# Patient Record
Sex: Female | Born: 1935 | Race: White | Hispanic: No | Marital: Married | Smoking: Never smoker
Health system: Southern US, Community
[De-identification: ages and names within clinical notes are randomized; demographics above are authoritative.]

## PROBLEM LIST (undated history)

## (undated) DIAGNOSIS — G4730 Sleep apnea, unspecified: Secondary | ICD-10-CM

## (undated) DIAGNOSIS — J45909 Unspecified asthma, uncomplicated: Secondary | ICD-10-CM

## (undated) DIAGNOSIS — M353 Polymyalgia rheumatica: Secondary | ICD-10-CM

## (undated) DIAGNOSIS — J40 Bronchitis, not specified as acute or chronic: Secondary | ICD-10-CM

## (undated) DIAGNOSIS — N183 Chronic kidney disease, stage 3 unspecified: Secondary | ICD-10-CM

## (undated) DIAGNOSIS — M5126 Other intervertebral disc displacement, lumbar region: Secondary | ICD-10-CM

## (undated) DIAGNOSIS — M1990 Unspecified osteoarthritis, unspecified site: Secondary | ICD-10-CM

## (undated) DIAGNOSIS — I499 Cardiac arrhythmia, unspecified: Secondary | ICD-10-CM

## (undated) DIAGNOSIS — D649 Anemia, unspecified: Secondary | ICD-10-CM

## (undated) DIAGNOSIS — M48061 Spinal stenosis, lumbar region without neurogenic claudication: Secondary | ICD-10-CM

## (undated) DIAGNOSIS — I272 Pulmonary hypertension, unspecified: Secondary | ICD-10-CM

## (undated) DIAGNOSIS — G709 Myoneural disorder, unspecified: Secondary | ICD-10-CM

## (undated) DIAGNOSIS — Z950 Presence of cardiac pacemaker: Secondary | ICD-10-CM

## (undated) DIAGNOSIS — I10 Essential (primary) hypertension: Secondary | ICD-10-CM

## (undated) HISTORY — PX: ESOPHAGOGASTRODUODENOSCOPY: SHX1529

## (undated) HISTORY — PX: APPENDECTOMY: SHX54

## (undated) HISTORY — PX: JOINT REPLACEMENT: SHX530

## (undated) HISTORY — PX: HEMORROIDECTOMY: SUR656

## (undated) HISTORY — PX: COLON SURGERY: SHX602

## (undated) HISTORY — PX: ABDOMINAL HYSTERECTOMY: SHX81

## (undated) HISTORY — PX: EYE SURGERY: SHX253

## (undated) HISTORY — PX: TRIGGER FINGER RELEASE: SHX641

## (undated) HISTORY — PX: CHOLECYSTECTOMY: SHX55

## (undated) HISTORY — PX: TONSILLECTOMY: SUR1361

---

## 2009-03-25 ENCOUNTER — Emergency Department (HOSPITAL_BASED_OUTPATIENT_CLINIC_OR_DEPARTMENT_OTHER): Admission: EM | Admit: 2009-03-25 | Discharge: 2009-03-25 | Payer: Self-pay | Admitting: Emergency Medicine

## 2018-02-24 ENCOUNTER — Other Ambulatory Visit (HOSPITAL_COMMUNITY): Payer: Self-pay | Admitting: Orthopedic Surgery

## 2018-02-24 DIAGNOSIS — M5441 Lumbago with sciatica, right side: Secondary | ICD-10-CM

## 2018-05-19 ENCOUNTER — Ambulatory Visit (HOSPITAL_COMMUNITY): Payer: Self-pay | Admitting: Orthopedic Surgery

## 2018-05-19 NOTE — H&P (Signed)
HPI For location, patient reports right. For severity, she reports pain level 7/10. For previous surgery, she reports none. For prior imaging, she reports none. For previous pt, she reports none. For work related, she reports no. For working, she reports no. Very pleasant female patient 84 years of age that presents to clinic with a long history of low back pain as well as radicular pain into her right lower extremity the patient has been receiving injections at [facility] she has received 2 ESI injections over the last few months she does report that her low back pain is decreased at this point and does report significant pain of the right lower extremity that radiates into her leg and foot. She reports weakness and severe pain especially with ambulation. And carries a diagnosis of atrial fibrillation as well as pulmonary hypertension she is unable to be on blood thinners because she is "a free bleeder."  ROS as noted in the HPI Reported by Patient CONSTITUTIONAL: no Fever, no Weight Gain, no Weight Loss  HEAD, EARS, EYES, NOSE, AND THROAT: no Dry Eyes, no Eye Irritation, no Vision Changes, no Sore Throat  CARDIOVASCULAR: LEG SWELLING, but no Chest Pain / Angina, no Palpitations, no Poor Circulation  RESPIRATORY: SHORTNESS OF BREATH, ASTHMA, but no Cough, no COPD  GASTROINTESTINAL: no Nausea / Vomiting, no Black Stools / Blood in Stool, no Diarrhea, no Vomiting Blood  GENITOURINARY: no Blood in Urine, no Difficulty Urinating  MUSCULOSKELETAL: JOINT PAIN, JOINT SWELLING, RHEUMATOID ARTHRITIS, but no Lost 2 or more inches in Height  SKIN: no Rash, no Varicose Veins  NEUROLOGIC: NUMBNESS, PROBLEMS WITH BALANCE, but no Seizures, no Dizziness  PSYCHIATRIC: DEPRESSION, but no Anxiety  ENDOCRINE: no Heat Intolerance  BLOOD / LYMPH: ANEMIA, BLEED EASILY, BRUISE EASILY, but no Swollen Glands   Allergies: NKDA  Physical Exam:  Clinical exam: Currently is a pleasant individual, who  appears younger than their stated age.  She Is alert and orientated 3.  Positive shortness of breath (baseline COPD on supplemental oxygen), chest pain.  Abdomen is soft and non-tender, negative loss of bowel and bladder control, no rebound tenderness.  Negative: skin lesions abrasions contusions  Peripheral pulses: 1+ and symmetrical dorsalis pedis/posterior tibialis pulses. Compartment soft and nontender.  Gait pattern: Significant right radicular leg pain altering her overall gait pattern.  Patient unfortunately had weakness and fell recently and has significant bruising and ecchymosis over the face.  Assistive devices: Currently using a walker/wheelchair depending upon the severity of her pain.  Neuro: Positive right L5 radiculopathy.  She has weakness of the EHL tibialis anterior and severe right leg pain in the L5 dermatome.  Positive straight leg raise test.  Positive numbness and dysesthesias.  No significant symptoms on the left lower extremity.  Musculoskeletal: Moderate low back pain.  No significant hip, knee, ankle pain with isolated joint range of motion.  Patient had recent epidural steroid injection which did not provide as much relief as the L5 selective nerve root block on the right side which was done prior to her seeing me.  Notes 2 days of significant pain relief following the right L5 selective nerve root block.  Her MRI does demonstrate L4-5 impingement and lateral recess stenosis causing irritation to the right L5 traversing nerve root.  No acute fracture is seen.  Assessment/Plan:  Patient presents today for preoperative H&P. We have gone over the surgical procedure which would be a lumbar decompression/discectomy L4-5. Patient has underlying spinal stenosis with a disc herniation causing  marked radicular right leg pain which is significantly affected her quality-of-life.  Despite injection therapy, activity modification, and pain medications she has unrelenting  debilitating radicular right leg pain and moderate to severe back pain. Preoperative MRI completed on 03/07/18 demonstrated spinal stenosis at L4-5 with a broad-based right-sided disc protrusion causing significant right lateral recess stenosis and impingement on the descending right L5 nerve root. There is also a left sided hard disc osteophyte causing left lateral recess stenosis. Mild degenerative changes at the L3-4 and L5-S1 level.  Because of the patient's underlying pulmonary and cardiac disease he did obtain preoperative medical clearance. Both her cardiologist and her pulmonologist had cleared her for surgery. Caveat being that if she has no flare up of her asthma or lung infection that she would be at an acceptable all be it higher risk for surgery.  I have gone over the surgical procedure with the patient and her family and all their questions were addressed. We have paid particular attention to the postoperative complications especially given the increased risk secondary to her pulmonary disease.  Risks and benefits of surgery were discussed with the patient. These include: Infection, bleeding, death, stroke, paralysis, ongoing or worse pain, need for additional surgery, leak of spinal fluid, adjacent segment degeneration requiring additional surgery, post-operative hematoma formation that can result in neurological compromise and the need for urgent/emergent re-operation. Loss in bowel and bladder control. Injury to major vessels that could result in the need for urgent abdominal surgery to stop bleeding. Risk of deep venous thrombosis (DVT) and the need for additional treatment. Recurrent disc herniation resulting in the need for revision surgery, which could include fusion surgery (utilizing instrumentation such as pedicle screws and intervertebral cages).  Additional risk: If instrumentation is used there is a risk of migration, or breakage of that hardware that could require additional  surgery.   We will plan on moving forward with the L4-5 lumbar decompression on 05/28/18.

## 2018-05-21 NOTE — Pre-Procedure Instructions (Signed)
Stephanie Santiago  05/21/2018      [id number] - [place], [place] - [address] [place] [address] Phone: [phone] Fax: [phone]    Your procedure is scheduled on Wednesday November 6.  Report to [facility] Admitting at 10:00 A.M.  Call this number if you have problems the morning of surgery:  [phone]   Remember:  Do not eat or drink after midnight.    Take these medicines the morning of surgery with A SIP OF WATER:   Atenolol (Tenormin) Digoxin (Lanoxin) Flecainide (Tambocor) Gabapentin (neurontin) Pantoprazole (protonix) Prednisone (Deltasone) Flovent inhaler Eye drops if needed Tramadol (ultram) if needed Albuterol if needed (please bring inhaler to hospital with you)  7 days prior to surgery STOP taking any Aspirin(unless otherwise instructed by your surgeon), Aleve, Naproxen, Ibuprofen, Motrin, Advil, Goody's, BC's, all herbal medications, fish oil, and all vitamins     Do not wear jewelry, make-up or nail polish.  Do not wear lotions, powders, or perfumes, or deodorant.  Do not shave 48 hours prior to surgery.  Men may shave face and neck.  Do not bring valuables to the hospital.  [facility] is not responsible for any belongings or valuables.  Contacts, dentures or bridgework may not be worn into surgery.  Leave your suitcase in the car.  After surgery it may be brought to your room.  For patients admitted to the hospital, discharge time will be determined by your treatment team.  Patients discharged the day of surgery will not be allowed to drive home.   Special instructions:    [place]- Preparing For Surgery  Before surgery, you can play an important role. Because skin is not sterile, your skin needs to be as free of germs as possible. You can reduce the number of germs on your skin by washing with CHG (chlorahexidine gluconate) Soap before surgery.  CHG is an  antiseptic cleaner which kills germs and bonds with the skin to continue killing germs even after washing.    Oral Hygiene is also important to reduce your risk of infection.  Remember - BRUSH YOUR TEETH THE MORNING OF SURGERY WITH YOUR REGULAR TOOTHPASTE  Please do not use if you have an allergy to CHG or antibacterial soaps. If your skin becomes reddened/irritated stop using the CHG.  Do not shave (including legs and underarms) for at least 48 hours prior to first CHG shower. It is OK to shave your face.  Please follow these instructions carefully.   1. Shower the NIGHT BEFORE SURGERY and the MORNING OF SURGERY with CHG.   2. If you chose to wash your hair, wash your hair first as usual with your normal shampoo.  3. After you shampoo, rinse your hair and body thoroughly to remove the shampoo.  4. Use CHG as you would any other liquid soap. You can apply CHG directly to the skin and wash gently with a scrungie or a clean washcloth.   5. Apply the CHG Soap to your body ONLY FROM THE NECK DOWN.  Do not use on open wounds or open sores. Avoid contact with your eyes, ears, mouth and genitals (private parts). Wash Face and genitals (private parts)  with your normal soap.  6. Wash thoroughly, paying special attention to the area where your surgery will be performed.  7. Thoroughly rinse your body with warm water from the neck down.  8. DO NOT shower/wash with your normal soap after using and rinsing off the CHG Soap.  9. Pat yourself dry with a CLEAN TOWEL.  10. Wear CLEAN PAJAMAS to bed the night before surgery, wear comfortable clothes the morning of surgery  11. Place CLEAN SHEETS on your bed the night of your first shower and DO NOT SLEEP WITH PETS.    Day of Surgery:  Do not apply any deodorants/lotions.  Please wear clean clothes to the hospital/surgery center.   Remember to brush your teeth WITH YOUR REGULAR TOOTHPASTE.    Please read over the following fact sheets that you  were given. Coughing and Deep Breathing and Surgical Site Infection Prevention

## 2018-05-22 ENCOUNTER — Encounter (HOSPITAL_COMMUNITY): Payer: Self-pay

## 2018-05-22 ENCOUNTER — Other Ambulatory Visit: Payer: Self-pay

## 2018-05-22 ENCOUNTER — Encounter (HOSPITAL_COMMUNITY)
Admission: RE | Admit: 2018-05-22 | Discharge: 2018-05-22 | Disposition: A | Discharge status: Home / Self Care | Payer: Medicare HMO | Source: Ambulatory Visit | Attending: Orthopedic Surgery | Admitting: Orthopedic Surgery

## 2018-05-22 DIAGNOSIS — N183 Chronic kidney disease, stage 3 (moderate): Secondary | ICD-10-CM | POA: Insufficient documentation

## 2018-05-22 DIAGNOSIS — Z950 Presence of cardiac pacemaker: Secondary | ICD-10-CM | POA: Diagnosis not present

## 2018-05-22 DIAGNOSIS — I129 Hypertensive chronic kidney disease with stage 1 through stage 4 chronic kidney disease, or unspecified chronic kidney disease: Secondary | ICD-10-CM | POA: Insufficient documentation

## 2018-05-22 DIAGNOSIS — I272 Pulmonary hypertension, unspecified: Secondary | ICD-10-CM | POA: Diagnosis not present

## 2018-05-22 DIAGNOSIS — D649 Anemia, unspecified: Secondary | ICD-10-CM | POA: Diagnosis not present

## 2018-05-22 DIAGNOSIS — J45909 Unspecified asthma, uncomplicated: Secondary | ICD-10-CM | POA: Diagnosis not present

## 2018-05-22 DIAGNOSIS — Z01812 Encounter for preprocedural laboratory examination: Secondary | ICD-10-CM | POA: Diagnosis present

## 2018-05-22 HISTORY — DX: Unspecified osteoarthritis, unspecified site: M19.90

## 2018-05-22 HISTORY — DX: Polymyalgia rheumatica: M35.3

## 2018-05-22 HISTORY — DX: Pulmonary hypertension, unspecified: I27.20

## 2018-05-22 HISTORY — DX: Presence of cardiac pacemaker: Z95.0

## 2018-05-22 HISTORY — DX: Bronchitis, not specified as acute or chronic: J40

## 2018-05-22 HISTORY — DX: Sleep apnea, unspecified: G47.30

## 2018-05-22 HISTORY — DX: Anemia, unspecified: D64.9

## 2018-05-22 HISTORY — DX: Chronic kidney disease, stage 3 (moderate): N18.3

## 2018-05-22 HISTORY — DX: Myoneural disorder, unspecified: G70.9

## 2018-05-22 HISTORY — DX: Chronic kidney disease, stage 3 unspecified: N18.30

## 2018-05-22 HISTORY — DX: Cardiac arrhythmia, unspecified: I49.9

## 2018-05-22 HISTORY — DX: Unspecified asthma, uncomplicated: J45.909

## 2018-05-22 HISTORY — DX: Essential (primary) hypertension: I10

## 2018-05-22 LAB — BASIC METABOLIC PANEL
ANION GAP: 11 (ref 5–15)
BUN: 19 mg/dL (ref 8–23)
CALCIUM: 8.8 mg/dL — AB (ref 8.9–10.3)
CO2: 27 mmol/L (ref 22–32)
Chloride: 100 mmol/L (ref 98–111)
Creatinine, Ser: 1.4 mg/dL — ABNORMAL HIGH (ref 0.44–1.00)
GFR calc Af Amer: 39 mL/min — ABNORMAL LOW (ref >60)
GFR calc non Af Amer: 33 mL/min — ABNORMAL LOW (ref >60)
GLUCOSE: 186 mg/dL — AB (ref 70–99)
POTASSIUM: 4.3 mmol/L (ref 3.5–5.1)
Sodium: 138 mmol/L (ref 135–145)

## 2018-05-22 LAB — CBC
HEMATOCRIT: 29.3 % — AB (ref 36.0–46.0)
Hemoglobin: 8.6 g/dL — ABNORMAL LOW (ref 12.0–15.0)
MCH: 28.6 pg (ref 26.0–34.0)
MCHC: 29.4 g/dL — ABNORMAL LOW (ref 30.0–36.0)
MCV: 97.3 fL (ref 80.0–100.0)
NRBC: 0 % (ref 0.0–0.2)
Platelets: 241 10*3/uL (ref 150–400)
RBC: 3.01 MIL/uL — ABNORMAL LOW (ref 3.87–5.11)
RDW: 17.5 % — AB (ref 11.5–15.5)
WBC: 10.6 10*3/uL — ABNORMAL HIGH (ref 4.0–10.5)

## 2018-05-22 LAB — ABO/RH: ABO/RH(D): O POS

## 2018-05-22 LAB — TYPE AND SCREEN
ABO/RH(D): O POS
Antibody Screen: NEGATIVE

## 2018-05-22 LAB — SURGICAL PCR SCREEN
MRSA, PCR: NEGATIVE
Staphylococcus aureus: POSITIVE — AB

## 2018-05-22 NOTE — Progress Notes (Signed)
PCP - Brooke Bonito MD Cardiologist -  Sandy Salaam MD  Chest x-ray - N/A EKG - 08/2017 REquested ECHO - 2018 Requested  PPM: medtronic. Leta Jungling emailed  Sleep Study - ~10 yrs ago CPAP -  Uses 2L O2  Blood Thinner Instructions: N/A Aspirin Instructions: N/A  Anesthesia review: yes. Requested outside docs.cardiac hx  Patient denies shortness of breath, fever, cough and chest pain at PAT appointment   Patient verbalized understanding of instructions that were given to them at the PAT appointment. Patient was also instructed that they will need to review over the PAT instructions again at home before surgery.

## 2018-05-23 ENCOUNTER — Encounter (HOSPITAL_COMMUNITY): Payer: Self-pay

## 2018-05-23 NOTE — Progress Notes (Signed)
I called a prescription for Mupirocin ointment to [facility], corner of [address], [place].

## 2018-05-23 NOTE — Anesthesia Preprocedure Evaluation (Addendum)
Anesthesia Evaluation  Patient identified by MRN, date of birth, ID band Patient awake    Reviewed: Allergy & Precautions, H&P , NPO status , Patient's Chart, lab work & pertinent test results, reviewed documented beta blocker date and time   Airway Mallampati: II  TM Distance: >3 FB Neck ROM: full    Dental no notable dental hx.    Pulmonary asthma , sleep apnea ,    Pulmonary exam normal breath sounds clear to auscultation       Cardiovascular Exercise Tolerance: Good hypertension, + dysrhythmias Atrial Fibrillation + pacemaker  Rhythm:regular Rate:Normal  EKG: 09/11/2017  Atrial fibrillation, ventricular rate 88.  CV: TTE 08/21/2016  Normal left ventricular size and systolic function with no appreciable segmental abnormality. Ejection fraction is visually estimated at 55-60% Mild to moderate tricuspid regurgitation with Moderate pulmonary hypertension and Mild Right atrial enlargement Mild mitral regurgitation, Mild Left atrial enlargement. Mild Right atrial enlargement   Neuro/Psych negative neurological ROS  negative psych ROS   GI/Hepatic negative GI ROS, Neg liver ROS,   Endo/Other  negative endocrine ROS  Renal/GU CRFRenal diseasenegative Renal ROS  negative genitourinary   Musculoskeletal  (+) Arthritis , Osteoarthritis,    Abdominal   Peds  Hematology negative hematology ROS (+) Blood dyscrasia, anemia ,   Anesthesia Other Findings   Reproductive/Obstetrics negative OB ROS                           Anesthesia Physical Anesthesia Plan  ASA: IV  Anesthesia Plan: General   Post-op Pain Management:    Induction: Intravenous  PONV Risk Score and Plan: 3 and Ondansetron and Treatment may vary due to age or medical condition  Airway Management Planned: Oral ETT  Additional Equipment:   Intra-op Plan:   Post-operative Plan: Extubation in OR and Possible Post-op  intubation/ventilation  Informed Consent: I have reviewed the patients History and Physical, chart, labs and discussed the procedure including the risks, benefits and alternatives for the proposed anesthesia with the patient or authorized representative who has indicated his/her understanding and acceptance.   Dental Advisory Given  Plan Discussed with: CRNA, Anesthesiologist and Surgeon  Anesthesia Plan Comments: (Pt has clearance from cardiology and pulmonology. See PAT note 05/23/2018 by Antionette Poles, PA-C.  )      Anesthesia Quick Evaluation

## 2018-05-23 NOTE — Progress Notes (Signed)
Anesthesia Chart Review:  Case:  [id number] Date/Time:  05/28/18 1145   Procedure:  L4-5 decompression/disectomy (N/A ) - 120 mins   Anesthesia type:  General   Pre-op diagnosis:  L4-5 disc with stenosis   Location:  [facility] ROOM 04 / [facility]   Surgeon:  Venita Lick, MD      DISCUSSION: 84 yo female never smoker. 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  She has clearance from pulmonology Lysle Pearl, PA-C 0/30/2019 stating "If timing is critical given the severity of her condition, I think she can proceed with surgery from pulmonary standpoint.  It is important for her to be placed on oxygen following surgery, if needed.  I would delay surgery if she is experiencing symptoms suggestive of asthma flareup from (severe cough, high heavy congestion, wheezing, fever, chills, etc.).  I would also get input from a cardiologist."  Clearance from cardiology Dr. Sandy Salaam "From a cardiac standpoint he believes that she is stable and doing well with the Micra, and her heart function.  He is more concerned with her lung function, and her severe COPD.  Feels that she would need clearance from pulmonologist."  Medtronic device rep emailed by PAT nursing. Device form faced to [facility] device [facility].  She will need DOS eval by anesthesiologist. Anticipate she can proceed as planned barring acute status change.  VS: BP (!) (P) 128/58   Pulse (P) 63   Resp (P) 20   Ht (P) 5\' 3"  (1.6 m)   SpO2 (P) 95%   PROVIDERS: Brooke Bonito, MD is PCP  Sandy Salaam, MD is Cardiologist  Foy Guadalajara, MD is Hematologist  Lysle Pearl, NP is Pulmonology provider  LABS: Labs reviewed: Acceptable for surgery. Low hgb and elevated creatinine c/w pt history, review of previous labs in care everywhere shows similar. Hgb called  to Dr. Shon Baton office. (all labs ordered are listed, but only abnormal results are displayed)  Labs Reviewed  SURGICAL PCR SCREEN - Abnormal; Notable for the following components:      Result Value   Staphylococcus aureus POSITIVE (*)    All other components within normal limits  CBC - Abnormal; Notable for the following components:   WBC 10.6 (*)    RBC 3.01 (*)    Hemoglobin 8.6 (*)    HCT 29.3 (*)    MCHC 29.4 (*)    RDW 17.5 (*)    All other components within normal limits  BASIC METABOLIC PANEL - Abnormal; Notable for the following components:   Glucose, Bld 186 (*)    Creatinine, Ser 1.40 (*)    Calcium 8.8 (*)    GFR calc non Af Amer 33 (*)    GFR calc Af Amer 39 (*)    All other components within normal limits  TYPE AND SCREEN  ABO/RH     IMAGES: CHEST - 2 VIEW 11/27/2017 (care everywhere):  COMPARISON: None.  FINDINGS: Unchanged position of lead this pacemaker. Unchanged mild cardiomegaly with calcific aortic atherosclerosis. Both lungs are clear. The visualized skeletal structures are unremarkable.  IMPRESSION: Mild cardiomegaly and calcific aortic atherosclerosis (ICD10-I70.0).  EKG: 09/11/2017 (care everywhere, narrative only, requested): Atrial fibrillation, ventricular rate 88.  CV: TTE 08/21/2016 (care everywhere):  Conclusions Summary Normal left ventricular size and systolic function with no appreciable segmental abnormality. Ejection fraction is visually estimated at 55-60%  Mild to moderate tricuspid regurgitation with Moderate pulmonary hypertension and Mild Right atrial enlargement Mild mitral regurgitation, Mild Left atrial enlargement. Mild Right atrial enlargement Past Medical History:  Diagnosis Date  . Anemia   . Arthritis    bilateral knees  . Asthma   . Bronchitis   . Dysrhythmia    A-fib  . Hypertension   . Neuromuscular disorder (HCC)    spine  . PMR (polymyalgia rheumatica) (HCC)   . Presence of permanent cardiac pacemaker    . Pulmonary hypertension (HCC)   . Sleep apnea     Past Surgical History:  Procedure Laterality Date  . ABDOMINAL HYSTERECTOMY    . APPENDECTOMY    . CHOLECYSTECTOMY    . COLON SURGERY     perferated bowel repair  . ESOPHAGOGASTRODUODENOSCOPY    . EYE SURGERY     bilateral cataracs  . HEMORROIDECTOMY    . JOINT REPLACEMENT     left hip  . TONSILLECTOMY    . TRIGGER FINGER RELEASE Bilateral     MEDICATIONS: . albuterol (PROVENTIL HFA;VENTOLIN HFA) 108 (90 Base) MCG/ACT inhaler  . alendronate (FOSAMAX) 70 MG tablet  . atenolol (TENORMIN) 25 MG tablet  . clorazepate (TRANXENE) 3.75 MG tablet  . digoxin (LANOXIN) 0.125 MG tablet  . flecainide (TAMBOCOR) 50 MG tablet  . FLOVENT HFA 110 MCG/ACT inhaler  . furosemide (LASIX) 20 MG tablet  . gabapentin (NEURONTIN) 300 MG capsule  . pantoprazole (PROTONIX) 40 MG tablet  . predniSONE (DELTASONE) 5 MG tablet  . tetrahydrozoline (VISINE) 0.05 % ophthalmic solution  . traMADol (ULTRAM) 50 MG tablet   No current facility-administered medications for this encounter.      Zannie Cove [facility] Short Stay Center/Anesthesiology Phone [phone] 05/23/2018 12:38 PM

## 2018-05-27 ENCOUNTER — Encounter (HOSPITAL_COMMUNITY): Payer: Self-pay | Admitting: Certified Registered Nurse Anesthetist

## 2018-05-28 ENCOUNTER — Ambulatory Visit (HOSPITAL_COMMUNITY): Payer: Medicare HMO

## 2018-05-28 ENCOUNTER — Inpatient Hospital Stay (HOSPITAL_COMMUNITY)
Admission: RE | Admit: 2018-05-28 | Discharge: 2018-06-03 | DRG: 519 | Disposition: A | Discharge status: Skilled Nursing Facility | Payer: Medicare HMO | Source: Ambulatory Visit | Attending: Orthopedic Surgery | Admitting: Orthopedic Surgery

## 2018-05-28 ENCOUNTER — Ambulatory Visit (HOSPITAL_COMMUNITY): Payer: Medicare HMO | Admitting: Physician Assistant

## 2018-05-28 ENCOUNTER — Other Ambulatory Visit: Payer: Self-pay

## 2018-05-28 ENCOUNTER — Ambulatory Visit (HOSPITAL_COMMUNITY): Payer: Medicare HMO | Admitting: Certified Registered Nurse Anesthetist

## 2018-05-28 ENCOUNTER — Inpatient Hospital Stay (HOSPITAL_COMMUNITY)
Admission: RE | Disposition: A | Discharge status: Skilled Nursing Facility | Payer: Self-pay | Source: Ambulatory Visit | Attending: Orthopedic Surgery

## 2018-05-28 ENCOUNTER — Encounter (HOSPITAL_COMMUNITY): Payer: Self-pay

## 2018-05-28 DIAGNOSIS — Z9981 Dependence on supplemental oxygen: Secondary | ICD-10-CM

## 2018-05-28 DIAGNOSIS — Z7983 Long term (current) use of bisphosphonates: Secondary | ICD-10-CM

## 2018-05-28 DIAGNOSIS — Z950 Presence of cardiac pacemaker: Secondary | ICD-10-CM

## 2018-05-28 DIAGNOSIS — M5126 Other intervertebral disc displacement, lumbar region: Principal | ICD-10-CM | POA: Diagnosis present

## 2018-05-28 DIAGNOSIS — Z7951 Long term (current) use of inhaled steroids: Secondary | ICD-10-CM | POA: Diagnosis not present

## 2018-05-28 DIAGNOSIS — Z79899 Other long term (current) drug therapy: Secondary | ICD-10-CM | POA: Diagnosis not present

## 2018-05-28 DIAGNOSIS — J449 Chronic obstructive pulmonary disease, unspecified: Secondary | ICD-10-CM | POA: Diagnosis present

## 2018-05-28 DIAGNOSIS — D638 Anemia in other chronic diseases classified elsewhere: Secondary | ICD-10-CM | POA: Diagnosis present

## 2018-05-28 DIAGNOSIS — G4730 Sleep apnea, unspecified: Secondary | ICD-10-CM | POA: Diagnosis present

## 2018-05-28 DIAGNOSIS — Z96642 Presence of left artificial hip joint: Secondary | ICD-10-CM | POA: Diagnosis present

## 2018-05-28 DIAGNOSIS — M1990 Unspecified osteoarthritis, unspecified site: Secondary | ICD-10-CM | POA: Diagnosis present

## 2018-05-28 DIAGNOSIS — Z419 Encounter for procedure for purposes other than remedying health state, unspecified: Secondary | ICD-10-CM

## 2018-05-28 DIAGNOSIS — Z881 Allergy status to other antibiotic agents status: Secondary | ICD-10-CM

## 2018-05-28 DIAGNOSIS — I482 Chronic atrial fibrillation, unspecified: Secondary | ICD-10-CM | POA: Diagnosis present

## 2018-05-28 DIAGNOSIS — I10 Essential (primary) hypertension: Secondary | ICD-10-CM

## 2018-05-28 DIAGNOSIS — M48061 Spinal stenosis, lumbar region without neurogenic claudication: Secondary | ICD-10-CM | POA: Diagnosis present

## 2018-05-28 DIAGNOSIS — Z7952 Long term (current) use of systemic steroids: Secondary | ICD-10-CM | POA: Diagnosis not present

## 2018-05-28 DIAGNOSIS — M353 Polymyalgia rheumatica: Secondary | ICD-10-CM | POA: Diagnosis present

## 2018-05-28 DIAGNOSIS — I129 Hypertensive chronic kidney disease with stage 1 through stage 4 chronic kidney disease, or unspecified chronic kidney disease: Secondary | ICD-10-CM | POA: Diagnosis present

## 2018-05-28 DIAGNOSIS — M21371 Foot drop, right foot: Secondary | ICD-10-CM | POA: Diagnosis present

## 2018-05-28 DIAGNOSIS — N183 Chronic kidney disease, stage 3 (moderate): Secondary | ICD-10-CM | POA: Diagnosis present

## 2018-05-28 DIAGNOSIS — M5116 Intervertebral disc disorders with radiculopathy, lumbar region: Secondary | ICD-10-CM | POA: Diagnosis present

## 2018-05-28 DIAGNOSIS — I272 Pulmonary hypertension, unspecified: Secondary | ICD-10-CM | POA: Diagnosis present

## 2018-05-28 DIAGNOSIS — Z888 Allergy status to other drugs, medicaments and biological substances status: Secondary | ICD-10-CM

## 2018-05-28 HISTORY — PX: LUMBAR LAMINECTOMY/DECOMPRESSION MICRODISCECTOMY: SHX5026

## 2018-05-28 HISTORY — DX: Other intervertebral disc displacement, lumbar region: M51.26

## 2018-05-28 HISTORY — DX: Spinal stenosis, lumbar region without neurogenic claudication: M48.061

## 2018-05-28 SURGERY — LUMBAR LAMINECTOMY/DECOMPRESSION MICRODISCECTOMY 1 LEVEL
Anesthesia: General

## 2018-05-28 MED ORDER — BUPIVACAINE-EPINEPHRINE (PF) 0.25% -1:200000 IJ SOLN
INTRAMUSCULAR | Status: AC
  Filled 2018-05-28: qty 30

## 2018-05-28 MED ORDER — FENTANYL CITRATE (PF) 250 MCG/5ML IJ SOLN
INTRAMUSCULAR | Status: AC
  Filled 2018-05-28: qty 5

## 2018-05-28 MED ORDER — PANTOPRAZOLE SODIUM 40 MG PO TBEC
40.0000 mg | DELAYED_RELEASE_TABLET | Freq: Every day | ORAL | Status: DC
  Administered 2018-05-29 – 2018-06-03 (×6): 40 mg via ORAL
  Filled 2018-05-28 (×6): qty 1

## 2018-05-28 MED ORDER — FUROSEMIDE 20 MG PO TABS
20.0000 mg | ORAL_TABLET | Freq: Every day | ORAL | Status: DC
  Administered 2018-05-28 – 2018-06-03 (×7): 20 mg via ORAL
  Filled 2018-05-28 (×7): qty 1

## 2018-05-28 MED ORDER — ALENDRONATE SODIUM 70 MG PO TABS: 70.0000 mg | ORAL_TABLET | ORAL | Status: DC

## 2018-05-28 MED ORDER — MEPERIDINE HCL 50 MG/ML IJ SOLN: 6.2500 mg | INTRAMUSCULAR | Status: DC | PRN

## 2018-05-28 MED ORDER — HEMOSTATIC AGENTS (NO CHARGE) OPTIME
TOPICAL | Status: DC | PRN
  Administered 2018-05-28 (×2): 1

## 2018-05-28 MED ORDER — FENTANYL CITRATE (PF) 250 MCG/5ML IJ SOLN
INTRAMUSCULAR | Status: DC | PRN
  Administered 2018-05-28: 100 ug via INTRAVENOUS

## 2018-05-28 MED ORDER — TRANEXAMIC ACID-NACL 1000-0.7 MG/100ML-% IV SOLN
1000.0000 mg | INTRAVENOUS | Status: AC
  Administered 2018-05-28: 1000 mg via INTRAVENOUS
  Filled 2018-05-28: qty 100

## 2018-05-28 MED ORDER — ROCURONIUM BROMIDE 100 MG/10ML IV SOLN
INTRAVENOUS | Status: DC | PRN
  Administered 2018-05-28: 50 mg via INTRAVENOUS

## 2018-05-28 MED ORDER — SODIUM CHLORIDE 0.9 % IV SOLN
0.0125 ug/kg/min | INTRAVENOUS | Status: AC
  Administered 2018-05-28: .05 ug/kg/min via INTRAVENOUS
  Filled 2018-05-28: qty 2000

## 2018-05-28 MED ORDER — LACTATED RINGERS IV SOLN: INTRAVENOUS | Status: DC

## 2018-05-28 MED ORDER — ONDANSETRON HCL 4 MG/2ML IJ SOLN
INTRAMUSCULAR | Status: DC | PRN
  Administered 2018-05-28: 4 mg via INTRAVENOUS

## 2018-05-28 MED ORDER — MORPHINE SULFATE (PF) 2 MG/ML IV SOLN: 1.0000 mg | INTRAVENOUS | Status: DC | PRN

## 2018-05-28 MED ORDER — PROPOFOL 10 MG/ML IV BOLUS
INTRAVENOUS | Status: DC | PRN
  Administered 2018-05-28: 100 mg via INTRAVENOUS

## 2018-05-28 MED ORDER — ALBUTEROL SULFATE HFA 108 (90 BASE) MCG/ACT IN AERS
INHALATION_SPRAY | RESPIRATORY_TRACT | Status: AC
  Filled 2018-05-28: qty 6.7

## 2018-05-28 MED ORDER — ACETAMINOPHEN 650 MG RE SUPP: 650.0000 mg | RECTAL | Status: DC | PRN

## 2018-05-28 MED ORDER — OXYCODONE HCL 5 MG PO TABS: 10.0000 mg | ORAL_TABLET | ORAL | Status: DC | PRN

## 2018-05-28 MED ORDER — DEXAMETHASONE SODIUM PHOSPHATE 10 MG/ML IJ SOLN
INTRAMUSCULAR | Status: DC | PRN
  Administered 2018-05-28: 10 mg via INTRAVENOUS

## 2018-05-28 MED ORDER — SODIUM CHLORIDE 0.9% FLUSH: 3.0000 mL | INTRAVENOUS | Status: DC | PRN

## 2018-05-28 MED ORDER — ONDANSETRON HCL 4 MG/2ML IJ SOLN
INTRAMUSCULAR | Status: AC
  Filled 2018-05-28: qty 2

## 2018-05-28 MED ORDER — METHYLPREDNISOLONE ACETATE 40 MG/ML IJ SUSP
INTRAMUSCULAR | Status: AC
  Filled 2018-05-28: qty 1

## 2018-05-28 MED ORDER — SODIUM CHLORIDE 0.9 % IV SOLN: 250.0000 mL | INTRAVENOUS | Status: DC

## 2018-05-28 MED ORDER — BUDESONIDE 0.25 MG/2ML IN SUSP
0.2500 mg | Freq: Two times a day (BID) | RESPIRATORY_TRACT | Status: DC
  Administered 2018-05-28 – 2018-06-03 (×10): 0.25 mg via RESPIRATORY_TRACT
  Filled 2018-05-28 (×13): qty 2

## 2018-05-28 MED ORDER — LIDOCAINE HCL (CARDIAC) PF 100 MG/5ML IV SOSY
PREFILLED_SYRINGE | INTRAVENOUS | Status: DC | PRN
  Administered 2018-05-28: 100 mg via INTRATRACHEAL

## 2018-05-28 MED ORDER — ATENOLOL 25 MG PO TABS
25.0000 mg | ORAL_TABLET | Freq: Two times a day (BID) | ORAL | Status: DC
  Administered 2018-05-28 – 2018-06-03 (×12): 25 mg via ORAL
  Filled 2018-05-28 (×13): qty 1

## 2018-05-28 MED ORDER — METHYLPREDNISOLONE ACETATE 40 MG/ML IJ SUSP
INTRAMUSCULAR | Status: DC | PRN
  Administered 2018-05-28: 40 mg

## 2018-05-28 MED ORDER — PHENOL 1.4 % MT LIQD: 1.0000 | OROMUCOSAL | Status: DC | PRN

## 2018-05-28 MED ORDER — MENTHOL 3 MG MT LOZG: 1.0000 | LOZENGE | OROMUCOSAL | Status: DC | PRN

## 2018-05-28 MED ORDER — GLYCOPYRROLATE PF 0.2 MG/ML IJ SOSY
PREFILLED_SYRINGE | INTRAMUSCULAR | Status: AC
  Filled 2018-05-28: qty 1

## 2018-05-28 MED ORDER — SUGAMMADEX SODIUM 200 MG/2ML IV SOLN
INTRAVENOUS | Status: DC | PRN
  Administered 2018-05-28: 200 mg via INTRAVENOUS

## 2018-05-28 MED ORDER — PROPOFOL 500 MG/50ML IV EMUL
INTRAVENOUS | Status: DC | PRN
  Administered 2018-05-28: 25 ug/kg/min via INTRAVENOUS

## 2018-05-28 MED ORDER — METHOCARBAMOL 500 MG PO TABS: 500.0000 mg | ORAL_TABLET | Freq: Four times a day (QID) | ORAL | Status: DC | PRN

## 2018-05-28 MED ORDER — ONDANSETRON HCL 4 MG/2ML IJ SOLN
4.0000 mg | Freq: Four times a day (QID) | INTRAMUSCULAR | Status: DC | PRN
  Administered 2018-05-30: 4 mg via INTRAVENOUS
  Filled 2018-05-28 (×2): qty 2

## 2018-05-28 MED ORDER — DEXAMETHASONE SODIUM PHOSPHATE 10 MG/ML IJ SOLN
INTRAMUSCULAR | Status: AC
  Filled 2018-05-28: qty 1

## 2018-05-28 MED ORDER — ARTIFICIAL TEARS OPHTHALMIC OINT
TOPICAL_OINTMENT | OPHTHALMIC | Status: AC
  Filled 2018-05-28: qty 3.5

## 2018-05-28 MED ORDER — LACTATED RINGERS IV SOLN
INTRAVENOUS | Status: DC
  Administered 2018-05-28: 11:00:00 via INTRAVENOUS

## 2018-05-28 MED ORDER — FENTANYL CITRATE (PF) 100 MCG/2ML IJ SOLN: 25.0000 ug | INTRAMUSCULAR | Status: DC | PRN

## 2018-05-28 MED ORDER — ACETAMINOPHEN 325 MG PO TABS
650.0000 mg | ORAL_TABLET | ORAL | Status: DC | PRN
  Administered 2018-06-02: 650 mg via ORAL
  Filled 2018-05-28: qty 2

## 2018-05-28 MED ORDER — OXYMETAZOLINE HCL 0.05 % NA SOLN
NASAL | Status: DC | PRN
  Administered 2018-05-28: 3 via NASAL

## 2018-05-28 MED ORDER — SODIUM CHLORIDE 0.9 % IV SOLN
INTRAVENOUS | Status: DC | PRN
  Administered 2018-05-28: 25 ug/min via INTRAVENOUS

## 2018-05-28 MED ORDER — BACITRACIN-NEOMYCIN-POLYMYXIN 400-5-5000 EX OINT
TOPICAL_OINTMENT | CUTANEOUS | Status: AC
  Filled 2018-05-28: qty 1

## 2018-05-28 MED ORDER — 0.9 % SODIUM CHLORIDE (POUR BTL) OPTIME
TOPICAL | Status: DC | PRN
  Administered 2018-05-28 (×2): 1000 mL

## 2018-05-28 MED ORDER — ACETAMINOPHEN 10 MG/ML IV SOLN
INTRAVENOUS | Status: DC | PRN
  Administered 2018-05-28: 1000 mg via INTRAVENOUS

## 2018-05-28 MED ORDER — LIDOCAINE 2% (20 MG/ML) 5 ML SYRINGE
INTRAMUSCULAR | Status: AC
  Filled 2018-05-28: qty 5

## 2018-05-28 MED ORDER — THROMBIN 20000 UNITS EX SOLR
CUTANEOUS | Status: DC | PRN
  Administered 2018-05-28: 20 mL

## 2018-05-28 MED ORDER — DIGOXIN 125 MCG PO TABS
0.1250 mg | ORAL_TABLET | ORAL | Status: DC
  Administered 2018-05-29 – 2018-06-02 (×3): 0.125 mg via ORAL
  Filled 2018-05-28 (×4): qty 1

## 2018-05-28 MED ORDER — FLECAINIDE ACETATE 50 MG PO TABS
25.0000 mg | ORAL_TABLET | Freq: Two times a day (BID) | ORAL | Status: DC
  Administered 2018-05-28 – 2018-06-03 (×12): 25 mg via ORAL
  Filled 2018-05-28 (×13): qty 1

## 2018-05-28 MED ORDER — ONDANSETRON HCL 4 MG PO TABS: 4.0000 mg | ORAL_TABLET | Freq: Four times a day (QID) | ORAL | Status: DC | PRN

## 2018-05-28 MED ORDER — BUPIVACAINE-EPINEPHRINE 0.25% -1:200000 IJ SOLN
INTRAMUSCULAR | Status: DC | PRN
  Administered 2018-05-28: 10 mL

## 2018-05-28 MED ORDER — METHOCARBAMOL 1000 MG/10ML IJ SOLN
500.0000 mg | Freq: Four times a day (QID) | INTRAVENOUS | Status: DC | PRN
  Filled 2018-05-28: qty 5

## 2018-05-28 MED ORDER — HYDROCODONE-ACETAMINOPHEN 5-325 MG PO TABS
1.0000 | ORAL_TABLET | ORAL | Status: DC | PRN
  Administered 2018-05-30: 1 via ORAL
  Filled 2018-05-28: qty 1

## 2018-05-28 MED ORDER — SODIUM CHLORIDE 0.9% FLUSH
3.0000 mL | Freq: Two times a day (BID) | INTRAVENOUS | Status: DC
  Administered 2018-05-28 – 2018-06-03 (×10): 3 mL via INTRAVENOUS

## 2018-05-28 MED ORDER — PROPOFOL 10 MG/ML IV BOLUS
INTRAVENOUS | Status: AC
  Filled 2018-05-28: qty 20

## 2018-05-28 MED ORDER — ALBUTEROL SULFATE HFA 108 (90 BASE) MCG/ACT IN AERS
INHALATION_SPRAY | RESPIRATORY_TRACT | Status: DC | PRN
  Administered 2018-05-28 (×2): 2 via RESPIRATORY_TRACT

## 2018-05-28 MED ORDER — GLYCOPYRROLATE 0.2 MG/ML IJ SOLN
INTRAMUSCULAR | Status: DC | PRN
  Administered 2018-05-28: 0.2 mg via INTRAVENOUS

## 2018-05-28 MED ORDER — THROMBIN (RECOMBINANT) 20000 UNITS EX SOLR
CUTANEOUS | Status: AC
  Filled 2018-05-28: qty 20000

## 2018-05-28 MED ORDER — CEFAZOLIN SODIUM-DEXTROSE 2-4 GM/100ML-% IV SOLN
2.0000 g | INTRAVENOUS | Status: AC
  Administered 2018-05-28: 2 g via INTRAVENOUS

## 2018-05-28 MED ORDER — CEFAZOLIN SODIUM-DEXTROSE 1-4 GM/50ML-% IV SOLN
1.0000 g | Freq: Three times a day (TID) | INTRAVENOUS | Status: DC
  Administered 2018-05-28 – 2018-06-03 (×17): 1 g via INTRAVENOUS
  Filled 2018-05-28 (×19): qty 50

## 2018-05-28 MED ORDER — GABAPENTIN 300 MG PO CAPS
300.0000 mg | ORAL_CAPSULE | Freq: Three times a day (TID) | ORAL | Status: DC
  Administered 2018-05-28 – 2018-06-03 (×18): 300 mg via ORAL
  Filled 2018-05-28 (×18): qty 1

## 2018-05-28 MED ORDER — ALBUTEROL SULFATE (2.5 MG/3ML) 0.083% IN NEBU
2.5000 mg | INHALATION_SOLUTION | RESPIRATORY_TRACT | Status: DC | PRN
  Administered 2018-05-29: 2.5 mg via RESPIRATORY_TRACT
  Filled 2018-05-28: qty 3

## 2018-05-28 MED ORDER — LACTATED RINGERS IV SOLN
INTRAVENOUS | Status: DC
  Administered 2018-05-28 (×2): via INTRAVENOUS

## 2018-05-28 MED ORDER — ROCURONIUM BROMIDE 50 MG/5ML IV SOSY
PREFILLED_SYRINGE | INTRAVENOUS | Status: AC
  Filled 2018-05-28: qty 5

## 2018-05-28 MED ORDER — ARTIFICIAL TEARS OPHTHALMIC OINT
TOPICAL_OINTMENT | OPHTHALMIC | Status: DC | PRN
  Administered 2018-05-28: 1 via OPHTHALMIC

## 2018-05-28 MED ORDER — CEFAZOLIN SODIUM-DEXTROSE 2-4 GM/100ML-% IV SOLN
INTRAVENOUS | Status: AC
  Filled 2018-05-28: qty 100

## 2018-05-28 SURGICAL SUPPLY — 65 items
BNDG GAUZE ELAST 4 BULKY (GAUZE/BANDAGES/DRESSINGS) ×3 IMPLANT
BUR EGG ELITE 4.0 (BURR) IMPLANT
BUR EGG ELITE 4.0MM (BURR)
BUR MATCHSTICK NEURO 3.0 LAGG (BURR) IMPLANT
CANISTER SUCT 3000ML PPV (MISCELLANEOUS) ×3 IMPLANT
CLOSURE STERI-STRIP 1/2X4 (GAUZE/BANDAGES/DRESSINGS) ×1
CLSR STERI-STRIP ANTIMIC 1/2X4 (GAUZE/BANDAGES/DRESSINGS) ×2 IMPLANT
CONT SPEC 4OZ CLIKSEAL STRL BL (MISCELLANEOUS) ×3 IMPLANT
CORD BI POLAR (MISCELLANEOUS) ×3 IMPLANT
COVER SURGICAL LIGHT HANDLE (MISCELLANEOUS) ×3 IMPLANT
COVER WAND RF STERILE (DRAPES) ×3 IMPLANT
DRAIN CHANNEL 15F RND FF W/TCR (WOUND CARE) IMPLANT
DRAIN TLS ROUND 10FR (DRAIN) ×3 IMPLANT
DRAPE POUCH INSTRU U-SHP 10X18 (DRAPES) ×3 IMPLANT
DRAPE SURG 17X23 STRL (DRAPES) ×3 IMPLANT
DRAPE U-SHAPE 47X51 STRL (DRAPES) ×3 IMPLANT
DRSG OPSITE POSTOP 3X4 (GAUZE/BANDAGES/DRESSINGS) ×3 IMPLANT
DRSG OPSITE POSTOP 4X6 (GAUZE/BANDAGES/DRESSINGS) ×3 IMPLANT
DURAPREP 26ML APPLICATOR (WOUND CARE) ×3 IMPLANT
ELECT BLADE 4.0 EZ CLEAN MEGAD (MISCELLANEOUS)
ELECT CAUTERY BLADE 6.4 (BLADE) ×3 IMPLANT
ELECT PENCIL ROCKER SW 15FT (MISCELLANEOUS) ×3 IMPLANT
ELECT REM PT RETURN 9FT ADLT (ELECTROSURGICAL) ×3
ELECTRODE BLDE 4.0 EZ CLN MEGD (MISCELLANEOUS) IMPLANT
ELECTRODE REM PT RTRN 9FT ADLT (ELECTROSURGICAL) ×1 IMPLANT
EVACUATOR SILICONE 100CC (DRAIN) IMPLANT
FLOSEAL 10ML (HEMOSTASIS) IMPLANT
GLOVE BIO SURGEON STRL SZ 6.5 (GLOVE) ×2 IMPLANT
GLOVE BIO SURGEONS STRL SZ 6.5 (GLOVE) ×1
GLOVE BIOGEL PI IND STRL 6.5 (GLOVE) ×1 IMPLANT
GLOVE BIOGEL PI IND STRL 8.5 (GLOVE) ×1 IMPLANT
GLOVE BIOGEL PI INDICATOR 6.5 (GLOVE) ×2
GLOVE BIOGEL PI INDICATOR 8.5 (GLOVE) ×2
GLOVE SS BIOGEL STRL SZ 8.5 (GLOVE) ×1 IMPLANT
GLOVE SUPERSENSE BIOGEL SZ 8.5 (GLOVE) ×2
GOWN STRL REUS W/ TWL LRG LVL3 (GOWN DISPOSABLE) ×2 IMPLANT
GOWN STRL REUS W/TWL 2XL LVL3 (GOWN DISPOSABLE) ×3 IMPLANT
GOWN STRL REUS W/TWL LRG LVL3 (GOWN DISPOSABLE) ×4
KIT BASIN OR (CUSTOM PROCEDURE TRAY) ×3 IMPLANT
KIT TURNOVER KIT B (KITS) ×3 IMPLANT
NEEDLE 22X1 1/2 (OR ONLY) (NEEDLE) ×3 IMPLANT
NEEDLE SPNL 18GX3.5 QUINCKE PK (NEEDLE) ×6 IMPLANT
NS IRRIG 1000ML POUR BTL (IV SOLUTION) ×3 IMPLANT
PACK LAMINECTOMY ORTHO (CUSTOM PROCEDURE TRAY) ×3 IMPLANT
PACK UNIVERSAL I (CUSTOM PROCEDURE TRAY) ×3 IMPLANT
PAD ARMBOARD 7.5X6 YLW CONV (MISCELLANEOUS) ×9 IMPLANT
PATTIES SURGICAL .5 X.5 (GAUZE/BANDAGES/DRESSINGS) ×3 IMPLANT
PATTIES SURGICAL .5 X1 (DISPOSABLE) ×3 IMPLANT
SPONGE SURGIFOAM ABS GEL 100 (HEMOSTASIS) ×3 IMPLANT
SURGIFLO W/THROMBIN 8M KIT (HEMOSTASIS) ×6 IMPLANT
SUT BONE WAX W31G (SUTURE) ×3 IMPLANT
SUT MON AB 3-0 SH 27 (SUTURE) ×2
SUT MON AB 3-0 SH27 (SUTURE) ×1 IMPLANT
SUT VIC AB 0 CT1 27 (SUTURE)
SUT VIC AB 0 CT1 27XBRD ANBCTR (SUTURE) IMPLANT
SUT VIC AB 1 CT1 18XCR BRD 8 (SUTURE) ×1 IMPLANT
SUT VIC AB 1 CT1 8-18 (SUTURE) ×2
SUT VIC AB 2-0 CT1 18 (SUTURE) ×3 IMPLANT
SYR BULB IRRIGATION 50ML (SYRINGE) ×3 IMPLANT
SYR CONTROL 10ML LL (SYRINGE) ×3 IMPLANT
TOWEL GREEN STERILE (TOWEL DISPOSABLE) ×3 IMPLANT
TOWEL GREEN STERILE FF (TOWEL DISPOSABLE) ×3 IMPLANT
TRAY FOLEY MTR SLVR 14FR STAT (SET/KITS/TRAYS/PACK) ×3 IMPLANT
WATER STERILE IRR 1000ML POUR (IV SOLUTION) IMPLANT
YANKAUER SUCT BULB TIP NO VENT (SUCTIONS) IMPLANT

## 2018-05-28 NOTE — Progress Notes (Signed)
Pt transferred to [id number], receiving RN at bedside, patient placed on 3L O2, call bell in reach, bed in lowest position, volunteers called to notify family of pts location.  Hermina Barters, RN

## 2018-05-28 NOTE — Evaluation (Signed)
Physical Therapy Evaluation Patient Details Name: Stephanie Santiago MRN: [id number] DOB: [birth year] Today's Date: 05/28/2018   History of Present Illness  Pt is an 84 y/o female s/p L4-5 lumbar decompression and discetomy. PMH includes COPD on 3L of O2, HTN, a fib, and s/p pacemaker.   Clinical Impression  Patient is s/p above surgery resulting in the deficits listed below (see PT Problem List). Pt practiced marching at EOB this session and pt with notable weakness and fatigue. Pt's LE buckling as well so returned to sitting. Required min to mod A for functional mobility tasks this session. Noted rash under pannus and breast; notified RN and RN addressed during session. Feel pt will need increased support at d/c and is high fall risk. Patient will benefit from skilled PT to increase their independence and safety with mobility (while adhering to their precautions) to allow discharge to the venue listed below.     Follow Up Recommendations SNF;Supervision/Assistance - 24 hour    Equipment Recommendations  None recommended by PT    Recommendations for Other Services       Precautions / Restrictions Precautions Precautions: Fall;Back Precaution Booklet Issued: Yes (comment) Precaution Comments: Reviewed back precautions with pt and pt's family.  Required Braces or Orthoses: Spinal Brace Spinal Brace: Lumbar corset Restrictions Weight Bearing Restrictions: No      Mobility  Bed Mobility Overal bed mobility: Needs Assistance Bed Mobility: Rolling;Sidelying to Sit;Sit to Sidelying Rolling: Min assist Sidelying to sit: Mod assist     Sit to sidelying: Min assist General bed mobility comments: Min A for rolling to side and mod A for trunk elevation. Min A for LE assist for return to sidelying. Verbal cues for use of log roll technique.   Transfers Overall transfer level: Needs assistance Equipment used: Rolling walker (2 wheeled) Transfers: Sit to/from Stand Sit to Stand: Min  assist         General transfer comment: Min A for lift assist and steadying. Attempted marching at EOB and pt fatiguing easily and LEs buckled, therefore returned to sitting. Further mobility deferred.   Ambulation/Gait                Stairs            Wheelchair Mobility    Modified Rankin (Stroke Patients Only)       Balance Overall balance assessment: Needs assistance Sitting-balance support: No upper extremity supported;Feet supported Sitting balance-Leahy Scale: Good     Standing balance support: Bilateral upper extremity supported;During functional activity Standing balance-Leahy Scale: Poor Standing balance comment: Reliant on BUE support                             Pertinent Vitals/Pain Pain Assessment: Faces Faces Pain Scale: Hurts little more Pain Location: back  Pain Descriptors / Indicators: Aching;Operative site guarding Pain Intervention(s): Limited activity within patient's tolerance;Monitored during session;Repositioned    Home Living Family/patient expects to be discharged to:: Skilled nursing facility Living Arrangements: Children                    Prior Function Level of Independence: Needs assistance   Gait / Transfers Assistance Needed: Per children, pt had just been ambulating to bathroom and back to recliner using RW. Very deconditioned per family.   ADL's / Homemaking Assistance Needed: Required assist from grandaughter for bathing.         Hand Dominance  Extremity/Trunk Assessment   Upper Extremity Assessment Upper Extremity Assessment: Defer to OT evaluation    Lower Extremity Assessment Lower Extremity Assessment: RLE deficits/detail;Generalized weakness RLE Deficits / Details: RLE weakness at baseline.     Cervical / Trunk Assessment Cervical / Trunk Assessment: Other exceptions Cervical / Trunk Exceptions: s/p lumbar surgery.   Communication   Communication: No difficulties   Cognition Arousal/Alertness: Awake/alert Behavior During Therapy: WFL for tasks assessed/performed Overall Cognitive Status: Within Functional Limits for tasks assessed                                        General Comments General comments (skin integrity, edema, etc.): Noted rash under pannus and breasts. RN notified and applied antifungal powder. Educated family about CIR vs SNF and pt reporting she would rather go SNF. Feel SNF is more appropriate at this time.     Exercises     Assessment/Plan    PT Assessment Patient needs continued PT services  PT Problem List Decreased strength;Decreased balance;Decreased mobility;Decreased activity tolerance;Decreased knowledge of use of DME;Decreased knowledge of precautions;Pain       PT Treatment Interventions DME instruction;Gait training;Functional mobility training;Therapeutic activities;Therapeutic exercise;Balance training;Patient/family education    PT Goals (Current goals can be found in the Care Plan section)  Acute Rehab PT Goals Patient Stated Goal: to go to rehab before going home.  PT Goal Formulation: With patient/family Time For Goal Achievement: 06/11/18 Potential to Achieve Goals: Good    Frequency Min 5X/week   Barriers to discharge        Co-evaluation               AM-PAC PT "6 Clicks" Daily Activity  Outcome Measure Difficulty turning over in bed (including adjusting bedclothes, sheets and blankets)?: Unable Difficulty moving from lying on back to sitting on the side of the bed? : Unable Difficulty sitting down on and standing up from a chair with arms (e.g., wheelchair, bedside commode, etc,.)?: Unable Help needed moving to and from a bed to chair (including a wheelchair)?: A Little Help needed walking in hospital room?: A Lot Help needed climbing 3-5 steps with a railing? : A Lot 6 Click Score: 10    End of Session   Activity Tolerance: Patient limited by fatigue Patient left:  in bed;with call bell/phone within reach;with family/visitor present Nurse Communication: Mobility status;Other (comment)(rash ) PT Visit Diagnosis: Unsteadiness on feet (R26.81);History of falling (Z91.81);Muscle weakness (generalized) (M62.81);Pain Pain - part of body: (back )    Time: 1717-1750 PT Time Calculation (min) (ACUTE ONLY): 33 min   Charges:   PT Evaluation $PT Eval Low Complexity: 1 Low PT Treatments $Therapeutic Activity: 8-22 mins        Gladys Damme, PT, DPT  Acute Rehabilitation Services  Pager: [phone] Office: [phone]   Lehman Prom 05/28/2018, 7:03 PM

## 2018-05-28 NOTE — Op Note (Signed)
Operative report  Preoperative diagnosis: L4-5 right disc herniation with spinal stenosis and radicular right leg pain  Postoperative diagnosis: Same  Operative procedure: L4-5 lumbar decompression and discectomy.  Medial facetectomy L4-5 with right L5 foraminotomy.  Complications: None  Indications: This is a very pleasant 84 year old female with multiple medical problems including COPD on home oxygen who presented to my office with severe debilitating back buttock and radicular right leg pain.  She is unable to ambulate and she could not sleep and she had no quality of life.  Injection for therapy failed to alleviate her symptoms until ultimately after getting cardiac and pulmonary clearance we elected to taken to the operating room for the aforementioned procedure.  All appropriate risks benefits and alternatives were discussed with the patient and family and consent was obtained.  Operative report: Patient was brought the operating room placed by the operating table.  After successful induction of general anesthesia and endotracheal intubation teds SCDs and a Foley were inserted.  Patient had significant topical fungal infection in her pannus.  As result I elected to put some Neosporin and a dry dressing.  The patient was then turned prone onto the Wilson frame.  All bony prominences well-padded and the back was prepped and draped in a standard fashion.  Timeout was taken to confirm patient, procedure, and all other pertinent data.  Once this was done 2 needles were placed into the back and an x-ray was taken to confirm localized incision.  Once identified the L4-5 level I marked out my incision site and infiltrated with quarter percent Marcaine.  Midline incision was made and sharp dissection was carried out down to the deep fascia.  Deep fascia was sharply incised and I stripped the paraspinal muscles to expose the L4 and L5 spinous process and the L4 lamina as well as the facet capsule at L4-5.  A  Penfield 4 was placed underneath the lamina of L4 and a second x-ray was taken to confirm I was at the appropriate level.  Once confirmed self-retaining retractors were placed and I remove the inferior portion of the L4 spinous process and the superior portion of the L5 spinous proce I then used a 3 mm Kerrison Roger to perform a generous of L4.  At this point I now exposed the thickened ligamentum flavum centrally and in the lateral recess.  Penfield 4 was used to dissect through the central raphae ligamentum flavum and then I used my 3 mm Kerrison Roger to resect the ligamentum flavum in the center region and exposed the dorsal surface of the thecal sac because she had significant radicular right leg pain I continued my decompression into the lateral recess on the right side.  On her MRI she did have some thickening ligamentum flavum causing some slight left lateral recess compression and this was removed as well using Kerrison Roger.  At this point I could freely pass a nerve hook into the left lateral recess superiorly inferiorly and out the L5 foramen.  Was pleased with this decompression so I continued on the right side.  The L5 nerve root was significantly compressed underneath the osteophyte at the medial aspect of the facet.  I gently dissected into the lateral recess and remove the medial portion of the L5 superior facet.  With the lateral recess decompressed I was able to gently mobilize the L5 nerve root and then decompress the overhanging osteophyte until I could palpate and visualize the medial border of the pedicle of L5.  I  then continued into the foramen removing all the osteophyte that was compressing down onto the L5 nerve at this point I could now freely mobilized the L5 nerve root into the foramen.  I then exposed the disc space and performed an annulotomy with a 15 blade scalpel.  A nerve hook was used to deliver multiple small fragments of calcified disc material.  I then used an Epstein  curette to remove any remaining loose fragments of disc material and resect some of the annulus.  Her graph at this point I used bipolar cautery to obtain hemostasis by coagulating the epidural veins in the lateral recess.  I could now with my nerve hook mobilized the L5 nerve root and follow it into the foramen.  There was no significant compression centrally or superiorly either  At this point pleased with the overall decompression  I irrigated the wound distally with normal saline and then obtained hemostasis using bipolar cautery and FloSeal.  After final irrigation I did place approximately 80 mg of Depo-Medrol (1/2 cc) over the nerve root and thecal sac for additional postoperative analgesia.  I then placed a thrombin-soaked Gelfoam patty over the entire laminotomy site and then I placed a deep drain which was taken out through a separate stab incision.  I closed the deep fascia with interrupted #1 Vicryl suture, then a layer 2-0 Vicryl suture and a 3-0 Monocryl.  Steri-Strips and dry dressing were applied.  The patient was ultimately extubated transfer the PACU that incident.  The end of the case all needle sponge counts were correct.  There are no adverse intraoperative events.

## 2018-05-28 NOTE — Brief Op Note (Signed)
05/28/2018  2:29 PM  PATIENT:  Adam Phenix  84 y.o. female  PRE-OPERATIVE DIAGNOSIS:  L4-5 disc with stenosis  POST-OPERATIVE DIAGNOSIS:  L4-5 disc with stenosis  PROCEDURE:  Procedure(s) with comments: L4-5 decompression/disectomy (N/A) - 120 mins  SURGEON:  Surgeon(s) and Role:    Venita Lick, MD - Primary  PHYSICIAN ASSISTANT:   ASSISTANTS: none   ANESTHESIA:   general  EBL:  50 mL   BLOOD ADMINISTERED:none  DRAINS: (1) TLS Drain(s) to suction in the back   LOCAL MEDICATIONS USED:  MARCAINE    and OTHER depomedrol  SPECIMEN:  No Specimen  DISPOSITION OF SPECIMEN:  N/A  COUNTS:  YES  TOURNIQUET:  * No tourniquets in log *  DICTATION: .Dragon Dictation  PLAN OF CARE: Admit to inpatient   PATIENT DISPOSITION:  PACU - hemodynamically stable.

## 2018-05-28 NOTE — OR Nursing (Signed)
Dr. Chestine Spore called and stated that needle are at L4-5, Dr. Shon Baton made aware.

## 2018-05-28 NOTE — H&P (Signed)
No significant change in the patient's clinical exam.  She continues to have significant back buttock and neuropathic right leg pain.  Reviewed the procedure with the patient and her family as well as the risks benefits and alternatives.  Plan on lumbar decompression discectomy.

## 2018-05-28 NOTE — Transfer of Care (Signed)
Immediate Anesthesia Transfer of Care Note  Patient: Stephanie Santiago  Procedure(s) Performed: L4-5 decompression/disectomy (N/A )  Patient Location: PACU  Anesthesia Type:General  Level of Consciousness: awake, alert  and patient cooperative  Airway & Oxygen Therapy: Patient Spontanous Breathing and Patient connected to face mask  Post-op Assessment: Report given to RN, Post -op Vital signs reviewed and stable, Patient moving all extremities X 4 and Patient able to stick tongue midline  Post vital signs: Reviewed and stable  Last Vitals:  Vitals Value Taken Time  BP 161/84 05/28/2018  2:17 PM  Temp    Pulse 75 05/28/2018  2:17 PM  Resp 7 05/28/2018  2:17 PM  SpO2 94 % 05/28/2018  2:17 PM  Vitals shown include unvalidated device data.  Last Pain:  Vitals:   05/28/18 1041  TempSrc:   PainSc: 0-No pain      Patients Stated Pain Goal: 3 (05/28/18 1041)  Complications: No apparent anesthesia complications

## 2018-05-28 NOTE — Plan of Care (Signed)

## 2018-05-28 NOTE — Consult Note (Signed)
[facility]                                                                                    Patient Demographics  Stephanie Santiago, is a 84 y.o. female  CSN: [id number]  MRN: [id number]  DOB - Jan 27, 1932  Admit Date - 05/28/2018  Outpatient Primary MD for the patient is Brooke Bonito, MD   With History of -  Past Medical History:  Diagnosis Date  . Anemia   . Arthritis    bilateral knees  . Asthma   . Bronchitis   . CKD (chronic kidney disease) stage 3, GFR 30-59 ml/min (HCC)   . Dysrhythmia    A-fib  . Hypertension   . Lumbar herniated disc   . Lumbar stenosis    L4-5  . Neuromuscular disorder (HCC)    spine  . PMR (polymyalgia rheumatica) (HCC)   . Presence of permanent cardiac pacemaker   . Pulmonary hypertension (HCC)   . Sleep apnea       Past Surgical History:  Procedure Laterality Date  . ABDOMINAL HYSTERECTOMY    . APPENDECTOMY    . CHOLECYSTECTOMY    . COLON SURGERY     perferated bowel repair  . ESOPHAGOGASTRODUODENOSCOPY    . EYE SURGERY     bilateral cataracs  . HEMORROIDECTOMY    . JOINT REPLACEMENT     left hip  . TONSILLECTOMY    . TRIGGER FINGER RELEASE Bilateral     in for   Postop  HPI  Stephanie Santiago  is a 84 y.o. female, with past medical history significant for polymyalgia rheumatica on steroids, sick sinus syndrome status post pacemaker, A. fib and pulmonary hypertension who presented today for an elective L4-5 lumbar decompression and discectomy and medial facetectomy with a right L5 foraminotomy .  Patient was seen after surgery and has no complaints.  She denies any chest pains, shortness of breath, nausea or vomiting.  She denies any headaches dizziness or loss of consciousness.    Review of Systems    In addition to the HPI above,  No Fever-chills, No Headache, No changes with Vision or hearing, No problems swallowing food or Liquids, No Chest pain, Cough or Shortness of Breath, No Abdominal pain,  No Nausea or Vommitting, Bowel movements are regular, No Blood in stool or Urine, No dysuria, No new skin rashes or bruises, No new joints pains-aches,  No new weakness, tingling, numbness in any extremity, No polyuria, polydypsia or polyphagia, No significant Mental Stressors.  A full 10 point Review of Systems was done, except as stated above, all other Review of Systems were negative.   Social History Social History   Tobacco Use  . Smoking status: Never Smoker  . Smokeless tobacco: Never Used  Substance Use Topics  . Alcohol use: Never    Frequency: Never     Family History History reviewed. No pertinent family history.   Prior to Admission medications   Medication Sig Start Date End Date Taking? Authorizing Provider  albuterol (PROVENTIL HFA;VENTOLIN HFA) 108 (90 Base) MCG/ACT inhaler Inhale 2 puffs into the lungs every 4 (four) hours as needed for wheezing or  shortness of breath.   Yes [provider]  alendronate (FOSAMAX) 70 MG tablet Take 70 mg by mouth every Monday. 03/19/18  Yes [provider]  atenolol (TENORMIN) 25 MG tablet Take 25 mg by mouth 2 (two) times daily. 03/18/18  Yes [provider]  digoxin (LANOXIN) 0.125 MG tablet Take 0.125 mg by mouth every other day. 04/29/18  Yes [provider]  flecainide (TAMBOCOR) 50 MG tablet Take 25 mg by mouth 2 (two) times daily. 05/12/18  Yes [provider]  FLOVENT HFA 110 MCG/ACT inhaler Inhale 2 puffs into the lungs 2 (two) times daily. 04/29/18  Yes [provider]  furosemide (LASIX) 20 MG tablet Take 20 mg by mouth daily. 03/07/18  Yes [provider]  gabapentin (NEURONTIN) 300 MG capsule Take 300 mg by mouth 3 (three) times daily. 05/14/18  Yes [provider]  pantoprazole (PROTONIX) 40 MG tablet Take 40 mg by mouth daily. 05/11/18  Yes [provider]  predniSONE (DELTASONE) 5 MG tablet Take 5 mg by mouth 2 (two) times daily. 03/27/18   Yes [provider]  traMADol (ULTRAM) 50 MG tablet Take 50 mg by mouth 3 (three) times daily. 05/16/18  Yes [provider]  clorazepate (TRANXENE) 3.75 MG tablet Take 3.75 mg by mouth daily as needed for anxiety.  02/21/18   [provider]  tetrahydrozoline (VISINE) 0.05 % ophthalmic solution Place 2 drops into both eyes 2 (two) times daily as needed (for dry eyes).    [provider]    Allergies  Allergen Reactions  . Escitalopram Nausea Only and Other (See Comments)    Pt stated it made her feel crazy  . Amoxicillin-Pot Clavulanate Diarrhea    Physical Exam  Vitals  Blood pressure (!) 164/74, pulse 71, temperature 98.1 F (36.7 C), temperature source Oral, resp. rate 16, height 5\' 3"  (1.6 m), weight 81.6 kg, SpO2 95 %.   1. General well-developed, well-nourished female, extremely pleasant, in no acute distress  2. Normal affect and insight, Not Suicidal or Homicidal, Awake Alert, Oriented X 3.  3. No F.N deficits, patient moving all extremities.  4. Ears and Eyes appear Normal, Conjunctivae clear, PERRLA. Moist Oral Mucosa.  5. Supple Neck, No JVD, No cervical lymphadenopathy appriciated, No Carotid Bruits.  6. Symmetrical Chest wall movement, Good air movement bilaterally, CTAB.  7. RRR, No Gallops, Rubs or Murmurs, No Parasternal Heave.  8. Positive Bowel Sounds, Abdomen Soft, Non tender, No organomegaly appriciated,No rebound -guarding or rigidity.  9.  No Cyanosis, Normal Skin Turgor, No Skin Rash or Bruise.  10. Good muscle tone,  joints appear normal , no effusions, Normal ROM.    Data Review  CBC Recent Labs  Lab 05/22/18 1553  WBC 10.6*  HGB 8.6*  HCT 29.3*  PLT 241  MCV 97.3  MCH 28.6  MCHC 29.4*  RDW 17.5*   ------------------------------------------------------------------------------------------------------------------  Chemistries  Recent Labs  Lab 05/22/18 1553  NA 138  K 4.3  CL 100  CO2 27   GLUCOSE 186*  BUN 19  CREATININE 1.40*  CALCIUM 8.8*   ------------------------------------------------------------------------------------------------------------------ estimated creatinine clearance is 30.3 mL/min (A) (by C-G formula based on SCr of 1.4 mg/dL (H)). ------------------------------------------------------------------------------------------------------------------ No results for input(s): TSH, T4TOTAL, T3FREE, THYROIDAB in the last 72 hours.  Invalid input(s): FREET3   Coagulation profile No results for input(s): INR, PROTIME in the last 168 hours. ------------------------------------------------------------------------------------------------------------------- No results for input(s): DDIMER in the last 72 hours. -------------------------------------------------------------------------------------------------------------------  Cardiac Enzymes  No results for input(s): CKMB, TROPONINI, MYOGLOBIN in the last 168 hours.  Invalid input(s): CK ------------------------------------------------------------------------------------------------------------------ Invalid input(s): POCBNP   ---------------------------------------------------------------------------------------------------------------  Urinalysis No results found for: COLORURINE, APPEARANCEUR, LABSPEC, PHURINE, GLUCOSEU, HGBUR, BILIRUBINUR, KETONESUR, PROTEINUR, UROBILINOGEN, NITRITE, LEUKOCYTESUR  ----------------------------------------------------------------------------------------------------------------   Imaging results:   Dg Lumbar Spine 1 View  Result Date: 05/28/2018 CLINICAL DATA:  Lumbar spine surgery. EXAM: LUMBAR SPINE - 1 VIEW COMPARISON:  MRI 03/07/2018. FINDINGS: Lumbar spine numbered as per prior MRI. Metallic marker noted at the L3-L4 and L4-L5 levels. No acute bony abnormality. Aortoiliac atherosclerotic vascular calcification. IMPRESSION: Metallic marker noted at the L3-L4 and L4-L5  levels. Electronically Signed   By: Maisie Fus  Register   On: 05/28/2018 14:10   Dg Spine Portable 1 View  Result Date: 05/28/2018 CLINICAL DATA:  Surgical localization. EXAM: PORTABLE SPINE - 1 VIEW COMPARISON:  Lumbar MRI 03/07/2018 FINDINGS: Lowest disc space L5-S1 as noted on the prior MRI report. Surgical instrument overlying the posterior spinal canal at the level the L5 pedicle. IMPRESSION: Surgical level L4-5. These results were called by telephone at the time of interpretation to the operating room and given to Forest Becker RN Electronically Signed   By: Marlan Palau M.D.   On: 05/28/2018 12:41    My personal review of EKG: Paced rhythm at 60 bpm  Assessment & Plan  Lumbar disc herniation with stenosis     Status post decompression and medial facetectomy at level of L4-5 .  A. fib/SSS status post pacemaker Continue with flecainide and digoxin  Chronic kidney disease stage III Monitor  PMR Continue with prednisone p.o.  Pulmonary hypertension Continue with Flovent  Osteoporosis Continue with Fosamax  History of asthma/COPD Continue with neb treatments and Flovent   DVT Prophylaxis SCDs  AM Labs Ordered, also please review Full Orders    Code Status full  Disposition Plan: Home  Time spent in minutes : 38 minutes  Condition GUARDED   @SIGNATURE @

## 2018-05-28 NOTE — Plan of Care (Signed)
?  Problem: Education: ?Goal: Knowledge of General Education information will improve ?Description: Including pain rating scale, medication(s)/side effects and non-pharmacologic comfort measures ?Outcome: Progressing ?  ?Problem: Health Behavior/Discharge Planning: ?Goal: Ability to manage health-related needs will improve ?Outcome: Progressing ?  ?Problem: Clinical Measurements: ?Goal: Ability to maintain clinical measurements within normal limits will improve ?Outcome: Progressing ?Goal: Will remain free from infection ?Outcome: Progressing ?Goal: Diagnostic test results will improve ?Outcome: Progressing ?Goal: Respiratory complications will improve ?Outcome: Progressing ?Goal: Cardiovascular complication will be avoided ?Outcome: Progressing ?  ?Problem: Coping: ?Goal: Level of anxiety will decrease ?Outcome: Progressing ?  ?Problem: Elimination: ?Goal: Will not experience complications related to bowel motility ?Outcome: Progressing ?Goal: Will not experience complications related to urinary retention ?Outcome: Progressing ?  ?Problem: Pain Managment: ?Goal: General experience of comfort will improve ?Outcome: Progressing ?  ?

## 2018-05-28 NOTE — Anesthesia Procedure Notes (Signed)
Procedure Name: Intubation Date/Time: 05/28/2018 11:55 AM Performed by: Janeece Riggers, MD Pre-anesthesia Checklist: Patient identified, Emergency Drugs available, Suction available and Patient being monitored Patient Re-evaluated:Patient Re-evaluated prior to induction Oxygen Delivery Method: Circle system utilized Preoxygenation: Pre-oxygenation with 100% oxygen Induction Type: IV induction Ventilation: Mask ventilation without difficulty and Oral airway inserted - appropriate to patient size Laryngoscope Size: Mac, 3 and Glidescope (T3) Grade View: Grade IV Tube type: Oral Tube size: 7.0 mm Number of attempts: 2 (DLx2 first with MAC3 with grade 4 view, then glidescope with grade 1 view) Airway Equipment and Method: Video-laryngoscopy and Stylet Placement Confirmation: ETT inserted through vocal cords under direct vision,  positive ETCO2 and breath sounds checked- equal and bilateral Secured at: 22 cm Tube secured with: Tape Dental Injury: Teeth and Oropharynx as per pre-operative assessment  Difficulty Due To: Difficulty was anticipated, Difficult Airway- due to anterior larynx and Difficult Airway- due to reduced neck mobility

## 2018-05-29 ENCOUNTER — Encounter (HOSPITAL_COMMUNITY): Payer: Self-pay | Admitting: Orthopedic Surgery

## 2018-05-29 DIAGNOSIS — M5126 Other intervertebral disc displacement, lumbar region: Principal | ICD-10-CM

## 2018-05-29 LAB — BASIC METABOLIC PANEL
ANION GAP: 9 (ref 5–15)
BUN: 18 mg/dL (ref 8–23)
CO2: 33 mmol/L — ABNORMAL HIGH (ref 22–32)
Calcium: 8.9 mg/dL (ref 8.9–10.3)
Chloride: 96 mmol/L — ABNORMAL LOW (ref 98–111)
Creatinine, Ser: 1.52 mg/dL — ABNORMAL HIGH (ref 0.44–1.00)
GFR, EST AFRICAN AMERICAN: 35 mL/min — AB (ref >60)
GFR, EST NON AFRICAN AMERICAN: 30 mL/min — AB (ref >60)
GLUCOSE: 125 mg/dL — AB (ref 70–99)
POTASSIUM: 3.6 mmol/L (ref 3.5–5.1)
Sodium: 138 mmol/L (ref 135–145)

## 2018-05-29 LAB — CBC
HEMATOCRIT: 28.9 % — AB (ref 36.0–46.0)
Hemoglobin: 8.3 g/dL — ABNORMAL LOW (ref 12.0–15.0)
MCH: 27.5 pg (ref 26.0–34.0)
MCHC: 28.7 g/dL — ABNORMAL LOW (ref 30.0–36.0)
MCV: 95.7 fL (ref 80.0–100.0)
NRBC: 0 % (ref 0.0–0.2)
PLATELETS: 245 10*3/uL (ref 150–400)
RBC: 3.02 MIL/uL — AB (ref 3.87–5.11)
RDW: 17.7 % — ABNORMAL HIGH (ref 11.5–15.5)
WBC: 11.8 10*3/uL — AB (ref 4.0–10.5)

## 2018-05-29 MED FILL — Thrombin (Recombinant) For Soln 20000 Unit: CUTANEOUS | Qty: 1 | Status: AC

## 2018-05-29 NOTE — Progress Notes (Signed)
PROGRESS NOTE    Stephanie Santiago  ZOX:[id number] DOB: 1932/04/23 DOA: 05/28/2018 PCP: Brooke Bonito, MD    Brief Narrative:  Stephanie Santiago  is a 84 y.o. female, with past medical history significant for polymyalgia rheumatica on steroids, sick sinus syndrome status post pacemaker, A. fib and pulmonary hypertension who presented today for an elective L4-5 lumbar decompression and discectomy and medial facetectomy with a right L5 foraminotomy . Assessment & Plan:   Active Problems:   Lumbar disc herniation   Lumbar disc Herniation:  S/p decompression and discectomy.  Pain control and physical therapy.   Chronic atrial fibrillation: s/p pacemaker, on flecainide and digoxin.   Stage 3 CKD;  Get labs today to check creatinine.   PMR:  Resume home prednisone.    Copd. Asthma on oxygen at home at 3 lit/min.  No wheezing heard.  No changes in meds.    Anemia of chronic disease:  Hemoglobin stable at 8.  Repeat labs today.     DVT prophylaxis: scd's Code Status: full code.  Family Communication: family at bedside.  Disposition Plan: pending physical therapy evaluation.    Procedures: L4-5 decompression/disectomy   Antimicrobials: Ancef from 11/6.    Subjective: Reports having been on 3 lit at home.  Occasional cough.  No sob, or chest pain. Able to walk to the bathroom with assistance.   Objective: Vitals:   05/29/18 0433 05/29/18 0853 05/29/18 0857 05/29/18 0912  BP: (!) 157/78     Pulse: 60   71  Resp: 16     Temp: 98.8 F (37.1 C)     TempSrc: Oral     SpO2: 100% 97% 97%   Weight:      Height:        Intake/Output Summary (Last 24 hours) at 05/29/2018 1441 Last data filed at 05/29/2018 1100 Gross per 24 hour  Intake 1228.5 ml  Output 1935 ml  Net -706.5 ml   Filed Weights   05/28/18 1000 05/28/18 1028  Weight: 77.7 kg 81.6 kg    Examination:  General exam: Appears calm and comfortable  Respiratory system: Clear to auscultation.  Respiratory effort normal. Cardiovascular system: S1 & S2 heard, RRR. No JVD, .  Gastrointestinal system: Abdomen is nondistended, soft and nontender.. Normal bowel sounds heard. Central nervous system: Alert and oriented. No focal neurological deficits. Extremities: 1 + leg edema.  Skin: No rashes, lesions or ulcers Psychiatry:  Mood & affect appropriate.     Data Reviewed: I have personally reviewed following labs and imaging studies  CBC: Recent Labs  Lab 05/22/18 1553  WBC 10.6*  HGB 8.6*  HCT 29.3*  MCV 97.3  PLT 241   Basic Metabolic Panel: Recent Labs  Lab 05/22/18 1553  NA 138  K 4.3  CL 100  CO2 27  GLUCOSE 186*  BUN 19  CREATININE 1.40*  CALCIUM 8.8*   GFR: Estimated Creatinine Clearance: 30.3 mL/min (A) (by C-G formula based on SCr of 1.4 mg/dL (H)). Liver Function Tests: No results for input(s): AST, ALT, ALKPHOS, BILITOT, PROT, ALBUMIN in the last 168 hours. No results for input(s): LIPASE, AMYLASE in the last 168 hours. No results for input(s): AMMONIA in the last 168 hours. Coagulation Profile: No results for input(s): INR, PROTIME in the last 168 hours. Cardiac Enzymes: No results for input(s): CKTOTAL, CKMB, CKMBINDEX, TROPONINI in the last 168 hours. BNP (last 3 results) No results for input(s): PROBNP in the last 8760 hours. HbA1C: No results for input(s): HGBA1C in the  last 72 hours. CBG: No results for input(s): GLUCAP in the last 168 hours. Lipid Profile: No results for input(s): CHOL, HDL, LDLCALC, TRIG, CHOLHDL, LDLDIRECT in the last 72 hours. Thyroid Function Tests: No results for input(s): TSH, T4TOTAL, FREET4, T3FREE, THYROIDAB in the last 72 hours. Anemia Panel: No results for input(s): VITAMINB12, FOLATE, FERRITIN, TIBC, IRON, RETICCTPCT in the last 72 hours. Sepsis Labs: No results for input(s): PROCALCITON, LATICACIDVEN in the last 168 hours.  Recent Results (from the past 240 hour(s))  Surgical pcr screen     Status:  Abnormal   Collection Time: 05/22/18  3:52 PM  Result Value Ref Range Status   MRSA, PCR NEGATIVE NEGATIVE Final   Staphylococcus aureus POSITIVE (A) NEGATIVE Final    Comment: (NOTE) The Xpert SA Assay (FDA approved for NASAL specimens in patients 72 years of age and older), is one component of a comprehensive surveillance program. It is not intended to diagnose infection nor to guide or monitor treatment. Performed at [facility], [address] [place], [place] [address]          Radiology Studies: Dg Lumbar Spine 1 View  Result Date: 05/28/2018 CLINICAL DATA:  Lumbar spine surgery. EXAM: LUMBAR SPINE - 1 VIEW COMPARISON:  MRI 03/07/2018. FINDINGS: Lumbar spine numbered as per prior MRI. Metallic marker noted at the L3-L4 and L4-L5 levels. No acute bony abnormality. Aortoiliac atherosclerotic vascular calcification. IMPRESSION: Metallic marker noted at the L3-L4 and L4-L5 levels. Electronically Signed   By: Maisie Fus  Register   On: 05/28/2018 14:10   Dg Spine Portable 1 View  Result Date: 05/28/2018 CLINICAL DATA:  Surgical localization. EXAM: PORTABLE SPINE - 1 VIEW COMPARISON:  Lumbar MRI 03/07/2018 FINDINGS: Lowest disc space L5-S1 as noted on the prior MRI report. Surgical instrument overlying the posterior spinal canal at the level the L5 pedicle. IMPRESSION: Surgical level L4-5. These results were called by telephone at the time of interpretation to the operating room and given to Forest Becker RN Electronically Signed   By: Marlan Palau M.D.   On: 05/28/2018 12:41        Scheduled Meds: . atenolol  25 mg Oral BID  . budesonide  0.25 mg Nebulization BID  . digoxin  0.125 mg Oral QODAY  . flecainide  25 mg Oral BID  . furosemide  20 mg Oral Daily  . gabapentin  300 mg Oral TID  . pantoprazole  40 mg Oral Daily  . sodium chloride flush  3 mL Intravenous Q12H   Continuous Infusions: . sodium chloride    .  ceFAZolin (ANCEF) IV 1 g (05/29/18 1336)  .  lactated ringers 0 mL/hr at 05/28/18 1150  . lactated ringers 85 mL/hr at 05/28/18 1740     LOS: 1 day    Time spent: 34 minutes.     Kathlen Mody, MD [facility] Pager [phone]  If 7PM-7AM, please contact night-coverage [email or web address] Password [email or web address] 05/29/2018, 2:41 PM

## 2018-05-29 NOTE — Care Management Note (Signed)
Case Management Note  Patient Details  Name: Stephanie Santiago MRN: [id number] Date of Birth: 12-16-1931  Subjective/Objective:                    Action/Plan:  Spoke w patient and son at bedside. They are both in agreement for SNF. Notified Morrie Sheldon CSW. No other CM needs identified.  Expected Discharge Date:                  Expected Discharge Plan:     In-House Referral:  Clinical Social Work  Discharge planning Services     Post Acute Care Choice:    Choice offered to:     DME Arranged:    DME Agency:     HH Arranged:    HH Agency:     Status of Service:  Completed, signed off  If discussed at [employer], dates discussed:    Additional Comments:  Lawerance Sabal, RN 05/29/2018, 11:32 AM

## 2018-05-29 NOTE — Progress Notes (Signed)
CSW provided patient son with list of SNF, they report preference for [facility] area. Patient and her son slightly discouraged with lack of options for SNF with [employer], CSW will continue to provide [place] options. Referrals sent to [facility] and [facility].   [place], [place] [phone]

## 2018-05-29 NOTE — Progress Notes (Signed)
CSW lvm with patient daughter regarding acceptance from [facility]. CSW asked daughter to please return call asap as SNF would need to start insurance auth.   [place], [place] [phone]

## 2018-05-29 NOTE — Plan of Care (Signed)

## 2018-05-29 NOTE — Progress Notes (Signed)
Physical Therapy Treatment Patient Details Name: Stephanie Santiago MRN: [id number] DOB: 1932/07/21 Today's Date: 05/29/2018    History of Present Illness Pt is an 84 y/o female s/p L4-5 lumbar decompression and discetomy. PMH includes COPD on 3L of O2, HTN, a fib, and s/p pacemaker.     PT Comments    Pt is eager to get up with therapy to use the bathroom, however is concerned that she will be incontinent and moves quickly towards the [facility] with decreased safety awareness. PT educated on need for safety and adherence to her spinal precautions. Pt able to perform sit>stand transfers and ambulate 25 feet with RW with minA. D/c plans remain appropriate at this time. PT will continue to follow acutely.     Follow Up Recommendations  SNF;Supervision/Assistance - 24 hour     Equipment Recommendations  None recommended by PT       Precautions / Restrictions Precautions Precautions: Fall;Back Precaution Booklet Issued: Yes (comment) Precaution Comments: Reviewed back precautions with pt, pt able to repeat back precautions but has trouble with carryover especially with twisting Required Braces or Orthoses: Spinal Brace Spinal Brace: Lumbar corset Restrictions Weight Bearing Restrictions: No    Mobility  Bed Mobility               General bed mobility comments: OOB in recliner on entry   Transfers Overall transfer level: Needs assistance Equipment used: Rolling walker (2 wheeled) Transfers: Sit to/from [employer] Sit to Stand: Min assist Stand pivot transfers: Min assist       General transfer comment: minA for power up and guiding with stand pivot to [facility] due to urgency, educated on need for safety with movement over incontinence, min A for steadying with 2x sit>stand from [facility] for pericare  Ambulation/Gait Ambulation/Gait assistance: Min Chemical engineer (Feet): 25 Feet Assistive device: Rolling walker (2 wheeled) Gait Pattern/deviations: Step-through  pattern;Decreased stride length;Shuffle;Trunk flexed Gait velocity: slowed Gait velocity interpretation: <1.8 ft/sec, indicate of risk for recurrent falls General Gait Details: minA for steadying with RW, vc for upright posture and reduced twisting with turning around        Balance Overall balance assessment: Needs assistance Sitting-balance support: No upper extremity supported;Feet supported Sitting balance-Leahy Scale: Good     Standing balance support: Bilateral upper extremity supported;During functional activity Standing balance-Leahy Scale: Poor Standing balance comment: Reliant on BUE support                            Cognition Arousal/Alertness: Awake/alert Behavior During Therapy: Anxious Overall Cognitive Status: Impaired/Different from baseline Area of Impairment: Memory                     Memory: Decreased recall of precautions         General Comments: difficulty with remembering precautions when moving, pt anxious about not being able to go to the SNF she wants         General Comments General comments (skin integrity, edema, etc.): Pt ambulated on 3L O2 via [place] maintaining SaO2 >90%O2 throughout session. Back brace not used during session as physicain indicated that it was only neccesary during longer distance ambualtion       Pertinent Vitals/Pain Pain Assessment: Faces Faces Pain Scale: Hurts even more Pain Location: back  Pain Descriptors / Indicators: Aching;Operative site guarding Pain Intervention(s): Limited activity within patient's tolerance;Monitored during session;Repositioned  PT Goals (current goals can now be found in the care plan section) Acute Rehab PT Goals Patient Stated Goal: to go to rehab before going home.  PT Goal Formulation: With patient/family Time For Goal Achievement: 06/11/18 Potential to Achieve Goals: Good Progress towards PT goals: Progressing toward goals    Frequency    Min  5X/week      PT Plan Current plan remains appropriate       AM-PAC PT "6 Clicks" Daily Activity  Outcome Measure  Difficulty turning over in bed (including adjusting bedclothes, sheets and blankets)?: Unable Difficulty moving from lying on back to sitting on the side of the bed? : Unable Difficulty sitting down on and standing up from a chair with arms (e.g., wheelchair, bedside commode, etc,.)?: Unable Help needed moving to and from a bed to chair (including a wheelchair)?: A Little Help needed walking in hospital room?: A Lot Help needed climbing 3-5 steps with a railing? : A Lot 6 Click Score: 10    End of Session Equipment Utilized During Treatment: Gait belt Activity Tolerance: Patient limited by fatigue Patient left: in bed;with call bell/phone within reach;with family/visitor present Nurse Communication: Mobility status;Other (comment)(rash ) PT Visit Diagnosis: Unsteadiness on feet (R26.81);History of falling (Z91.81);Muscle weakness (generalized) (M62.81);Pain Pain - part of body: (back )     Time: 1610-9604 PT Time Calculation (min) (ACUTE ONLY): 23 min  Charges:  $Gait Training: 8-22 mins $Therapeutic Activity: 8-22 mins                     Stephanie Santiago PT, DPT Acute Rehabilitation Services Pager [phone] Office [phone]'   Stephanie Santiago [facility] 05/29/2018, 5:24 PM

## 2018-05-29 NOTE — [place] FL2 (Signed)
[place] MEDICAID FL2 LEVEL OF CARE SCREENING TOOL     IDENTIFICATION  Patient Name: Stephanie Santiago Birthdate: 1931/11/04 Sex: female Admission Date (Current Location): 05/28/2018  [facility] and [place] Number:  Producer, television/film/video and Address:  [facility] [place], [address] [place], [place] [address]      Provider Number: [id number]  Attending Physician Name and Address:  Venita Lick, MD  Relative Name and Phone Number:  Elita Quick (daughter)    Current Level of Care: SNF Recommended Level of Care: Skilled Nursing Facility Prior Approval Number:    Date Approved/Denied: 05/29/18 PASRR Number: [id number]  Discharge Plan: SNF    Current Diagnoses: Patient Active Problem List   Diagnosis Date Noted  . Lumbar disc herniation 05/28/2018    Orientation RESPIRATION BLADDER Height & Weight     Self, Time, Situation, Place  O2(nasal cannula 3L/min) Continent Weight: 180 lb (81.6 kg) Height:  5\' 3"  (160 cm)  BEHAVIORAL SYMPTOMS/MOOD NEUROLOGICAL BOWEL NUTRITION STATUS      Continent Diet(see discharge summary)  AMBULATORY STATUS COMMUNICATION OF NEEDS Skin   Limited Assist Verbally Surgical wounds(back closed incision, moisture associated skin right breast and left leg)                       Personal Care Assistance Level of Assistance  Bathing, Feeding, Dressing, Total care Bathing Assistance: Limited assistance Feeding assistance: Independent Dressing Assistance: Limited assistance Total Care Assistance: Limited assistance   Functional Limitations Info  Sight, Hearing, Speech Sight Info: Adequate Hearing Info: Adequate Speech Info: Adequate    SPECIAL CARE FACTORS FREQUENCY  PT (By licensed PT), OT (By licensed OT)     PT Frequency: min 5x weekly OT Frequency: min 2x weekly            Contractures Contractures Info: Not present    Additional Factors Info  Code Status, Allergies Code Status Info: full Allergies Info:  Allergies:  Escitalopram, Amoxicillin-pot Clavulanate           Current Medications (05/29/2018):  This is the current hospital active medication list Current Facility-Administered Medications  Medication Dose Route Frequency Provider Last Rate Last Dose  . 0.9 %  sodium chloride infusion  250 mL Intravenous Continuous Venita Lick, MD      . acetaminophen (TYLENOL) tablet 650 mg  650 mg Oral Q4H PRN Venita Lick, MD       Or  . acetaminophen (TYLENOL) suppository 650 mg  650 mg Rectal Q4H PRN Venita Lick, MD      . albuterol (PROVENTIL) (2.5 MG/3ML) 0.083% nebulizer solution 2.5 mg  2.5 mg Inhalation Q4H PRN Venita Lick, MD   2.5 mg at 05/29/18 0857  . atenolol (TENORMIN) tablet 25 mg  25 mg Oral BID Venita Lick, MD   25 mg at 05/29/18 0912  . budesonide (PULMICORT) nebulizer solution 0.25 mg  0.25 mg Nebulization BID Venita Lick, MD   0.25 mg at 05/29/18 0852  . ceFAZolin (ANCEF) IVPB 1 g/50 mL premix  1 g Intravenous Q8H Venita Lick, MD 100 mL/hr at 05/29/18 1336 1 g at 05/29/18 1336  . digoxin (LANOXIN) tablet 0.125 mg  0.125 mg Oral Norlene Duel, MD   0.125 mg at 05/29/18 0913  . flecainide (TAMBOCOR) tablet 25 mg  25 mg Oral BID Venita Lick, MD   25 mg at 05/29/18 0913  . furosemide (LASIX) tablet 20 mg  20 mg Oral Daily Venita Lick, MD   20  mg at 05/29/18 0912  . gabapentin (NEURONTIN) capsule 300 mg  300 mg Oral TID Venita Lick, MD   300 mg at 05/29/18 0912  . HYDROcodone-acetaminophen (NORCO/VICODIN) 5-325 MG per tablet 1 tablet  1 tablet Oral Q4H PRN Venita Lick, MD      . lactated ringers infusion   Intravenous Continuous Venita Lick, MD 0 mL/hr at 05/28/18 1150    . lactated ringers infusion   Intravenous Continuous Venita Lick, MD 85 mL/hr at 05/28/18 1740    . menthol-cetylpyridinium (CEPACOL) lozenge 3 mg  1 lozenge Oral PRN Venita Lick, MD       Or  . phenol (CHLORASEPTIC) mouth spray 1 spray  1 spray Mouth/Throat PRN Venita Lick, MD      . methocarbamol (ROBAXIN) tablet 500 mg  500 mg Oral Q6H PRN Venita Lick, MD      . ondansetron [facility]) tablet 4 mg  4 mg Oral Q6H PRN Venita Lick, MD       Or  . ondansetron [facility]) injection 4 mg  4 mg Intravenous Q6H PRN Venita Lick, MD      . pantoprazole (PROTONIX) EC tablet 40 mg  40 mg Oral Daily Venita Lick, MD   40 mg at 05/29/18 0912  . sodium chloride flush (NS) 0.9 % injection 3 mL  3 mL Intravenous Q12H Venita Lick, MD   3 mL at 05/28/18 2052  . sodium chloride flush (NS) 0.9 % injection 3 mL  3 mL Intravenous PRN Venita Lick, MD         Discharge Medications: Please see discharge summary for a list of discharge medications.  Relevant Imaging Results:  Relevant Lab Results:   Additional Information SSN: [id number]  Gildardo Griffes, LCSW

## 2018-05-29 NOTE — Progress Notes (Signed)
    Subjective: Procedure(s) (LRB): L4-5 decompression/disectomy (N/A) 1 Day Post-Op  Patient reports pain as 1 on 0-10 scale.  Reports decreased leg pain reports incisional back pain   Positive void Negative bowel movement Positive flatus Negative chest pain or shortness of breath  Objective: Vital signs in last 24 hours: Temp:  [98.1 F (36.7 C)-98.8 F (37.1 C)] 98.8 F (37.1 C) (11/07 0433) Pulse Rate:  [53-81] 71 (11/07 0912) Resp:  [11-20] 16 (11/07 0433) BP: (133-193)/(64-93) 157/78 (11/07 0433) SpO2:  [92 %-100 %] 97 % (11/07 0857) Weight:  [81.6 kg] 81.6 kg (11/06 1028)  Intake/Output from previous day: 11/06 0701 - 11/07 0700 In: 1988.5 [I.V.:1888.5; IV Piggyback:50] Out: 2235 [Urine:2160; Drains:25; Blood:50]  Labs: No results for input(s): WBC, RBC, HCT, PLT in the last 72 hours. No results for input(s): NA, K, CL, CO2, BUN, CREATININE, GLUCOSE, CALCIUM in the last 72 hours. No results for input(s): LABPT, INR in the last 72 hours.  Physical Exam: Neurologically intact ABD soft Sensation intact distally Dorsiflexion/Plantar flexion intact Incision: dressing C/D/I and no drainage Compartment soft Body mass index is 31.89 kg/m.   Assessment/Plan: Patient stable  xrays n/a Continue mobilization with physical therapy Continue care  Advance diet Up with therapy  1. Will d/c drain today 2. Waiting PT assessment - question of CIR or SNF 3. Appreciate Hospitalist eval - patient stable.  4. Continue mobilization 5. Will change to inpatient status Venita Lick, MD [facility] [phone]

## 2018-05-29 NOTE — Evaluation (Signed)
Occupational Therapy Evaluation Patient Details Name: Stephanie Santiago MRN: [id number] DOB: 24-Dec-1931 Today's Date: 05/29/2018    History of Present Illness Pt is an 84 y/o female s/p L4-5 lumbar decompression and discetomy. PMH includes COPD on 3L of O2, HTN, a fib, and s/p pacemaker.    Clinical Impression   Pt presents with decreased balance, awareness of back precautions, decreased activity tolerance, and decreased ability to access her LB for ADL. Today, Pt min assist for transfers, min A for short mobility to bathroom - requires cues throughout, assist to don brace and she will require skilled OT in the acute setting and afterwards at the SNF level to maximize safety and independence in ADL and functional transfers.     Follow Up Recommendations  SNF;Supervision/Assistance - 24 hour    Equipment Recommendations  Other (comment)(defer to next venue)    Recommendations for Other Services       Precautions / Restrictions Precautions Precautions: Fall;Back Precaution Booklet Issued: Yes (comment) Precaution Comments: Reviewed back precautions with pt and pt's family.  Required Braces or Orthoses: Spinal Brace Spinal Brace: Lumbar corset Restrictions Weight Bearing Restrictions: No      Mobility Bed Mobility               General bed mobility comments: Pt in recliner at beginning and end of session  Transfers Overall transfer level: Needs assistance Equipment used: Rolling walker (2 wheeled) Transfers: Sit to/from Stand Sit to Stand: Min assist         General transfer comment: Min A for lift assist and steadying. vc for safe hand placement and RW management to maintain back precautions    Balance Overall balance assessment: Needs assistance Sitting-balance support: No upper extremity supported;Feet supported Sitting balance-Leahy Scale: Good     Standing balance support: Bilateral upper extremity supported;During functional activity Standing balance-Leahy  Scale: Poor Standing balance comment: Reliant on BUE support                           ADL either performed or assessed with clinical judgement   ADL Overall ADL's : Needs assistance/impaired Eating/Feeding: Modified independent;Sitting   Grooming: Wash/dry hands;Min guard;Sitting Grooming Details (indicate cue type and reason): sink level - only able to complete one grooming task - decreased activity tolerance Upper Body Bathing: Min guard;Sitting   Lower Body Bathing: Maximal assistance;Sitting/lateral leans   Upper Body Dressing : Moderate assistance;Sitting Upper Body Dressing Details (indicate cue type and reason): to don brace - Pt able to verbalize sequencing Lower Body Dressing: Maximal assistance   Toilet Transfer: Minimal assistance;Ambulation;RW;Cueing for safety Toilet Transfer Details (indicate cue type and reason): vc for safe hand placement, use of grab bars Toileting- Clothing Manipulation and Hygiene: Set up;Sitting/lateral lean Toileting - Clothing Manipulation Details (indicate cue type and reason): front and rear peri care     Functional mobility during ADLs: Minimal assistance;Min guard;Cueing for sequencing;Cueing for safety;Rolling walker General ADL Comments: decreased activity tolerance, decreased balance, decreased safety awareness and precaution recall - requires max A for LB ADL at this time     Vision         Perception     Praxis      Pertinent Vitals/Pain Pain Assessment: Faces Faces Pain Scale: Hurts even more Pain Location: back  Pain Descriptors / Indicators: Aching;Operative site guarding;Sore Pain Intervention(s): Monitored during session;Repositioned     Hand Dominance Right   Extremity/Trunk Assessment Upper Extremity Assessment Upper Extremity Assessment: Generalized  weakness   Lower Extremity Assessment Lower Extremity Assessment: Defer to PT evaluation RLE Deficits / Details: RLE weakness at baseline.     Cervical / Trunk Assessment Cervical / Trunk Assessment: Other exceptions Cervical / Trunk Exceptions: s/p lumbar surgery.    Communication Communication Communication: No difficulties   Cognition Arousal/Alertness: Awake/alert Behavior During Therapy: WFL for tasks assessed/performed Overall Cognitive Status: Within Functional Limits for tasks assessed                                     General Comments  Pt ambualted to bathroom without O2, upon return to recliner O2 saturations were at 70 - wuth nasal canula back in place quickly returned to low 90's    Exercises     Shoulder Instructions      Home Living Family/patient expects to be discharged to:: Skilled nursing facility Living Arrangements: Children                                      Prior Functioning/Environment Level of Independence: Needs assistance  Gait / Transfers Assistance Needed: Per children, pt had just been ambulating to bathroom and back to recliner using RW. Very deconditioned per family.  ADL's / Homemaking Assistance Needed: Required assist from grandaughter for bathing.    Comments: Pt is eager and anxious to improve quality of life        OT Problem List: Decreased activity tolerance;Impaired balance (sitting and/or standing)      OT Treatment/Interventions: Self-care/ADL training;DME and/or AE instruction;Therapeutic activities;Patient/family education;Balance training    OT Goals(Current goals can be found in the care plan section) Acute Rehab OT Goals Patient Stated Goal: to go to rehab before going home.  OT Goal Formulation: With patient Time For Goal Achievement: 06/12/18 Potential to Achieve Goals: Good ADL Goals Pt Will Perform Lower Body Bathing: with min assist;with adaptive equipment;sit to/from stand Pt Will Perform Lower Body Dressing: with min assist;with adaptive equipment;sit to/from stand Pt Will Transfer to Toilet: with modified  independence;ambulating Pt Will Perform Toileting - Clothing Manipulation and hygiene: with modified independence;sit to/from stand;with adaptive equipment Additional ADL Goal #1: Pt will perform bed mobility at supervision level maintaining back precautions prior to engaging in ADL activity  OT Frequency: Min 2X/week   Barriers to D/C:            Co-evaluation              AM-PAC PT "6 Clicks" Daily Activity     Outcome Measure Help from another person eating meals?: None Help from another person taking care of personal grooming?: A Little Help from another person toileting, which includes using toliet, bedpan, or urinal?: A Little Help from another person bathing (including washing, rinsing, drying)?: A Lot Help from another person to put on and taking off regular upper body clothing?: A Lot Help from another person to put on and taking off regular lower body clothing?: A Lot 6 Click Score: 16   End of Session Equipment Utilized During Treatment: Back brace;Rolling walker Nurse Communication: Mobility status;Precautions  Activity Tolerance: Patient tolerated treatment well Patient left: in chair;with call bell/phone within reach  OT Visit Diagnosis: Unsteadiness on feet (R26.81);Other abnormalities of gait and mobility (R26.89);Pain Pain - part of body: (back)  Time: 1610-9604 OT Time Calculation (min): 22 min Charges:  OT General Charges $OT Visit: 1 Visit OT Evaluation $OT Eval Moderate Complexity: 1 Mod  Sherryl Manges OTR/L Acute Rehabilitation Services Pager: [phone] Office: [phone]  Evern Bio Adie Vilar 05/29/2018, 3:13 PM

## 2018-05-29 NOTE — Clinical Social Work Note (Signed)
Clinical Social Work Assessment  Patient Details  Name: Stephanie Santiago MRN: [id number] Date of Birth: [birth year]  Date of referral:  05/29/18               Reason for consult:  Discharge Planning                Permission sought to share information with:  Case Manager, Facility Medical sales representative, Family Supports Permission granted to share information::  Yes, Verbal Permission Granted  Name::     Pam  Agency::  SNFs  Relationship::  daughter  Contact Information:  [phone]  Housing/Transportation Living arrangements for the past 2 months:  Single Family Home Source of Information:  Patient Patient Interpreter Needed:    Criminal Activity/Legal Involvement Pertinent to Current Situation/Hospitalization:  No - Comment as needed Significant Relationships:  Adult Children Lives with:  Self Do you feel safe going back to the place where you live?  No Need for family participation in patient care:  Yes (Comment)  Care giving concerns:  CSW received referral for possible SNF placement at time of discharge. Spoke with patient regarding possibility of SNF placement . Patient's  family  is currently unable to care for her at their home given patient's current needs and fall risk.  Patient and her son in room expressed understanding of PT recommendation and are agreeable to SNF placement at time of discharge. CSW to continue to follow and assist with discharge planning needs.    Social Worker assessment / plan:  Spoke with patient and  Son concerning possibility of rehab at [facility] before returning home.    Employment status:  Retired Database administrator PT Recommendations:  Skilled Nursing Facility Information / Referral to community resources:  Skilled Nursing Facility  Patient/Family's Response to care:  Patient and   son  recognize need for rehab before returning home and are agreeable to a SNF in [place] however all are out of network with [employer], CSW spoke  with patient regarding [facility] which is current preference. CSW explained insurance authorization process. Patient's family reported that they want patient to get stronger to be able to come back home.    Patient/Family's Understanding of and Emotional Response to Diagnosis, Current Treatment, and Prognosis:  Patient/family is realistic regarding therapy needs and expressed being hopeful for SNF placement. Patient expressed understanding of CSW role and discharge process as well as medical condition. No questions/concerns about plan or treatment.    Emotional Assessment Appearance:  Appears stated age Attitude/Demeanor/Rapport:  Engaged, Gracious Affect (typically observed):  Accepting Orientation:  Oriented to Self, Oriented to Place, Oriented to  Time, Oriented to Situation Alcohol / Substance use:  Not Applicable Psych involvement (Current and /or in the community):  No (Comment)  Discharge Needs  Concerns to be addressed:  Discharge Planning Concerns Readmission within the last 30 days:  No Current discharge risk:  Dependent with Mobility Barriers to Discharge:  Continued Medical Work up   [employer], LCSW 05/29/2018, 3:54 PM

## 2018-05-29 NOTE — Anesthesia Postprocedure Evaluation (Signed)
Anesthesia Post Note  Patient: Adam Phenix  Procedure(s) Performed: L4-5 decompression/disectomy (N/A )     Patient location during evaluation: PACU Anesthesia Type: General Level of consciousness: awake and alert Pain management: pain level controlled Vital Signs Assessment: post-procedure vital signs reviewed and stable Respiratory status: spontaneous breathing, nonlabored ventilation, respiratory function stable and patient connected to nasal cannula oxygen Cardiovascular status: blood pressure returned to baseline and stable Postop Assessment: no apparent nausea or vomiting Anesthetic complications: no    Last Vitals:  Vitals:   05/29/18 0857 05/29/18 0912  BP:    Pulse:  71  Resp:    Temp:    SpO2: 97%     Last Pain:  Vitals:   05/29/18 0912  TempSrc:   PainSc: 0-No pain                 Ayani Ospina

## 2018-05-30 MED ORDER — HYDRALAZINE HCL 20 MG/ML IJ SOLN
10.0000 mg | Freq: Four times a day (QID) | INTRAMUSCULAR | Status: DC | PRN
  Administered 2018-05-30: 10 mg via INTRAVENOUS
  Filled 2018-05-30: qty 1

## 2018-05-30 MED ORDER — HYDROCODONE-ACETAMINOPHEN 5-325 MG PO TABS: 1.0000 | ORAL_TABLET | ORAL | 0 refills | Status: DC | PRN

## 2018-05-30 NOTE — Discharge Summary (Addendum)
Physician Discharge Summary   Patient ID: Stephanie Santiago MRN: [id number] DOB/AGE: February 12, 1932 84 y.o.  Admit date: 05/28/2018 Discharge date: 05/31/2018  Primary Diagnosis:   L4-5 disc with stenosis  Admission Diagnoses:  Past Medical History:  Diagnosis Date  . Anemia   . Arthritis    bilateral knees  . Asthma   . Bronchitis   . CKD (chronic kidney disease) stage 3, GFR 30-59 ml/min (HCC)   . Dysrhythmia    A-fib  . Hypertension   . Lumbar herniated disc   . Lumbar stenosis    L4-5  . 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 PMR (polymyalgia rheumatica) (HCC)   . Presence of permanent cardiac pacemaker   . Pulmonary hypertension ([place])   . Sleep apnea    Discharge Diagnoses:   Active Problems:   Lumbar disc herniation  Procedure:  Procedure(s) (LRB): L4-5 decompression/disectomy (N/A)   Consults: hospitalist  HPI:  see H&P    Laboratory Data: Hospital Outpatient Visit on 05/22/2018  Component Date Value Ref Range Status  . MRSA, PCR 05/22/2018 NEGATIVE  NEGATIVE Final  . Staphylococcus aureus 05/22/2018 POSITIVE* NEGATIVE Final   Comment: (NOTE) The Xpert SA Assay (FDA approved for NASAL specimens in patients 70 years of age and older), is one component of a comprehensive surveillance program. It is not intended to diagnose infection nor to guide or monitor treatment. Performed at [facility], [place] [address] [place], [place] [address]   . WBC 05/22/2018 10.6* 4.0 - 10.5 K/uL Final  . RBC 05/22/2018 3.01* 3.87 - 5.11 MIL/uL Final  . Hemoglobin 05/22/2018 8.6* 12.0 - 15.0 g/dL Final  . HCT 05/22/2018 29.3* 36.0 - 46.0 % Final  . MCV 05/22/2018 97.3  80.0 - 100.0 fL Final  . MCH 05/22/2018 28.6  26.0 - 34.0 pg Final  . MCHC 05/22/2018 29.4* 30.0 - 36.0 g/dL Final  . RDW 05/22/2018 17.5* 11.5 - 15.5 % Final  . Platelets 05/22/2018 241  150 - 400 K/uL Final  . nRBC 05/22/2018 0.0  0.0 - 0.2 % Final   Performed at [facility]  [address] [place], [place] [address]  . Sodium 05/22/2018 138  135 - 145 mmol/L Final  . Potassium 05/22/2018 4.3  3.5 - 5.1 mmol/L Final  . Chloride 05/22/2018 100  98 - 111 mmol/L Final  . CO2 05/22/2018 27  22 - 32 mmol/L Final  . Glucose, Bld 05/22/2018 186* 70 - 99 mg/dL Final  . BUN 05/22/2018 19  8 - 23 mg/dL Final  . Creatinine, Ser 05/22/2018 1.40* 0.44 - 1.00 mg/dL Final  . Calcium 05/22/2018 8.8* 8.9 - 10.3 mg/dL Final  . GFR calc non Af Amer 05/22/2018 33* >60 mL/min Final  . GFR calc Af Amer 05/22/2018 39* >60 mL/min Final   Comment: (NOTE) The eGFR has been calculated using the CKD EPI equation. This calculation has not been validated in all clinical situations. eGFR's persistently <60 mL/min signify possible Chronic Kidney Disease.   Georgiann Hahn gap 05/22/2018 11  5 - 15 Final   Performed at [facility], [place] [address], [place], [place] [address]  . ABO/RH(D) 05/22/2018 O POS   Final  . Antibody Screen 05/22/2018 NEG   Final  . Sample Expiration 05/22/2018 06/05/2018   Final  . Extend sample reason 05/22/2018    Final                   Value:NO  TRANSFUSIONS OR PREGNANCY IN THE PAST 3 MONTHS Performed at [facility], [place] [address], [place], [place] [address]   . ABO/RH(D) 05/22/2018    Final                   Value:O POS Performed at [facility], [place] [address] [place], [place] [address]    Recent Labs    05/29/18 1721  HGB 8.3*   Recent Labs    05/29/18 1721  WBC 11.8*  RBC 3.02*  HCT 28.9*  PLT 245   Recent Labs    05/29/18 1721  NA 138  K 3.6  CL 96*  CO2 33*  BUN 18  CREATININE 1.52*  GLUCOSE 125*  CALCIUM 8.9   No results for input(s): LABPT, INR in the last 72 hours.  X-Rays:Dg Lumbar Spine 1 View  Result Date: 05/28/2018 CLINICAL DATA:  Lumbar spine surgery. EXAM: LUMBAR SPINE - 1 VIEW COMPARISON:  MRI 03/07/2018. FINDINGS: Lumbar spine numbered as per prior MRI. Metallic marker noted at the L3-L4 and L4-L5 levels. No acute  bony abnormality. Aortoiliac atherosclerotic vascular calcification. IMPRESSION: Metallic marker noted at the L3-L4 and L4-L5 levels. Electronically Signed   By: Marcello Moores  Register   On: 05/28/2018 14:10   Dg Spine Portable 1 View  Result Date: 05/28/2018 CLINICAL DATA:  Surgical localization. EXAM: PORTABLE SPINE - 1 VIEW COMPARISON:  Lumbar MRI 03/07/2018 FINDINGS: Lowest disc space L5-S1 as noted on the prior MRI report. Surgical instrument overlying the posterior spinal canal at the level the L5 pedicle. IMPRESSION: Surgical level L4-5. These results were called by telephone at the time of interpretation to the operating room and given to Virginia Rochester RN Electronically Signed   By: Franchot Gallo M.D.   On: 05/28/2018 12:41    EKG: Orders placed or performed during the hospital encounter of 05/28/18  . EKG 12-Lead  . EKG 12-Lead  . EKG 12 lead  . EKG 12 lead     Hospital Course: Patient was admitted to [facility] and taken to the OR and underwent the above state procedure without complications.  Patient tolerated the procedure well and was later transferred to the recovery room and then to the orthopaedic floor for postoperative care.  They were given PO and IV analgesics for pain control following their surgery.  They were given 24 hours of postoperative antibiotics.   PT was consulted postop to assist with mobility and transfers.  The patient was allowed to be WBAT with therapy and was taught back precautions. Discharge planning was consulted to help with postop disposition and equipment needs.  Patient had a fair night on the evening of surgery and started to get up OOB with therapy on day one. Patient was seen in rounds daily and was ready for d/c on day two.  They were given discharge instructions and dressing directions.  They were instructed on when to follow up in the office with Dr. Rolena Infante.  Diet: Regular diet Activity:WBAT, back brace, lumbar precautions Follow-up:in 14  days Disposition - SNF- [facility] Discharged Condition: good   Discharge Instructions    Call MD / Call 911   Complete by:  As directed    If you experience chest pain or shortness of breath, CALL 911 and be transported to the hospital emergency room.  If you develope a fever above 101 F, pus (white drainage) or increased drainage or redness at the wound, or calf pain, call your surgeon's  office.   Constipation Prevention   Complete by:  As directed    Drink plenty of fluids.  Prune juice may be helpful.  You may use a stool softener, such as Colace (over the counter) 100 mg twice a day.  Use MiraLax (over the counter) for constipation as needed.   Diet - low sodium heart healthy   Complete by:  As directed    Incentive spirometry RT   Complete by:  As directed    Increase activity slowly as tolerated   Complete by:  As directed      Allergies as of 05/30/2018      Reactions   Escitalopram Nausea Only, Other (See Comments)   Pt stated it made her feel crazy   Amoxicillin-pot Clavulanate Diarrhea      Medication List    TAKE these medications   albuterol 108 (90 Base) MCG/ACT inhaler Commonly known as:  PROVENTIL HFA;VENTOLIN HFA Inhale 2 puffs into the lungs every 4 (four) hours as needed for wheezing or shortness of breath.   alendronate 70 MG tablet Commonly known as:  FOSAMAX Take 70 mg by mouth every Monday.   atenolol 25 MG tablet Commonly known as:  TENORMIN Take 25 mg by mouth 2 (two) times daily.   clorazepate 3.75 MG tablet Commonly known as:  TRANXENE Take 3.75 mg by mouth daily as needed for anxiety.   digoxin 0.125 MG tablet Commonly known as:  LANOXIN Take 0.125 mg by mouth every other day.   flecainide 50 MG tablet Commonly known as:  TAMBOCOR Take 25 mg by mouth 2 (two) times daily.   FLOVENT HFA 110 MCG/ACT inhaler Generic drug:  fluticasone Inhale 2 puffs into the lungs 2 (two) times daily.   furosemide 20 MG tablet Commonly known as:   LASIX Take 20 mg by mouth daily.   gabapentin 300 MG capsule Commonly known as:  NEURONTIN Take 300 mg by mouth 3 (three) times daily.   HYDROcodone-acetaminophen 5-325 MG tablet Commonly known as:  NORCO/VICODIN Take 1 tablet by mouth every 4 (four) hours as needed for moderate pain ((score 4 to 6)).   pantoprazole 40 MG tablet Commonly known as:  PROTONIX Take 40 mg by mouth daily.   predniSONE 5 MG tablet Commonly known as:  DELTASONE Take 5 mg by mouth 2 (two) times daily.   traMADol 50 MG tablet Commonly known as:  ULTRAM Take 50 mg by mouth 3 (three) times daily.   VISINE 0.05 % ophthalmic solution Generic drug:  tetrahydrozoline Place 2 drops into both eyes 2 (two) times daily as needed (for dry eyes).        Signed: Lacie Draft, PA-C Orthopaedic Surgery 05/30/2018, 8:05 AM   Discharge date updated, DC today as above. No medical issues overnight and pt stable for DC  [facility], PA-C

## 2018-05-30 NOTE — Plan of Care (Signed)

## 2018-05-30 NOTE — Progress Notes (Signed)
Subjective: 2 Days Post-Op Procedure(s) (LRB): L4-5 decompression/disectomy (N/A) Patient reports pain as minimal.  Reports legs are feeling better. Plan for d/c to [facility] today. States she had a bad night because her BP was high.  Objective: Vital signs in last 24 hours: Temp:  [98.1 F (36.7 C)-98.3 F (36.8 C)] 98.2 F (36.8 C) (11/08 0300) Pulse Rate:  [63-79] 78 (11/08 0300) Resp:  [18] 18 (11/08 0300) BP: (145-185)/(61-77) 145/61 (11/08 0352) SpO2:  [97 %-99 %] 97 % (11/08 0300)  Intake/Output from previous day: 11/07 0701 - 11/08 0700 In: 240 [P.O.:240] Out: 10 [Drains:10] Intake/Output this shift: No intake/output data recorded.  Recent Labs    05/29/18 1721  HGB 8.3*   Recent Labs    05/29/18 1721  WBC 11.8*  RBC 3.02*  HCT 28.9*  PLT 245   Recent Labs    05/29/18 1721  NA 138  K 3.6  CL 96*  CO2 33*  BUN 18  CREATININE 1.52*  GLUCOSE 125*  CALCIUM 8.9   No results for input(s): LABPT, INR in the last 72 hours.  Neurologically intact ABD soft Neurovascular intact Sensation intact distally Intact pulses distally Dorsiflexion/Plantar flexion intact Incision: dressing C/D/I and no drainage No cellulitis present Compartment soft no sign of DVT  Assessment/Plan: 2 Days Post-Op Procedure(s) (LRB): L4-5 decompression/disectomy (N/A) Advance diet Up with therapy D/C IV fluids Discharge to SNF  Plan d/c to [facility] today as long as medical team agrees she is stable for d/c Continue back brace  Stephanie Santiago M. 05/30/2018, 8:02 AM

## 2018-05-30 NOTE — Plan of Care (Signed)
  Problem: Education: Goal: Knowledge of General Education information will improve Description: Including pain rating scale, medication(s)/side effects and non-pharmacologic comfort measures Outcome: Progressing   Problem: Health Behavior/Discharge Planning: Goal: Ability to manage health-related needs will improve Outcome: Progressing   Problem: Clinical Measurements: Goal: Ability to maintain clinical measurements within normal limits will improve Outcome: Progressing Goal: Will remain free from infection Outcome: Progressing Goal: Diagnostic test results will improve Outcome: Progressing Goal: Respiratory complications will improve Outcome: Progressing Goal: Cardiovascular complication will be avoided Outcome: Progressing   Problem: Skin Integrity: Goal: Risk for impaired skin integrity will decrease Outcome: Progressing   Problem: Safety: Goal: Ability to remain free from injury will improve Outcome: Progressing   Problem: Pain Managment: Goal: General experience of comfort will improve Outcome: Progressing   

## 2018-05-31 NOTE — Progress Notes (Signed)
Pt had epistaxis x1 at 18:30, vital signs taken and recorded. Pt denied nausea, dizziness, no pain, assisted back to bed, now resting comfortably, no complaints. Will continue to monitor, endorsed accordingly.

## 2018-05-31 NOTE — Progress Notes (Signed)
Pt stable for discharge and she denies any complaints at this time.  TRH will sign off.  Please call if needed.   Kathlen Mody MD

## 2018-05-31 NOTE — Plan of Care (Signed)
  Problem: Activity: Goal: Risk for activity intolerance will decrease Outcome: Progressing   Problem: Coping: Goal: Level of anxiety will decrease Outcome: Progressing   Problem: Elimination: Goal: Will not experience complications related to bowel motility Outcome: Progressing   Problem: Pain Managment: Goal: General experience of comfort will improve Outcome: Progressing   Problem: Safety: Goal: Ability to remain free from injury will improve Outcome: Progressing   

## 2018-05-31 NOTE — Progress Notes (Signed)
Physical Therapy Treatment Patient Details Name: Stephanie Santiago MRN: [id number] DOB: February 25, 1932 Today's Date: 05/31/2018    History of Present Illness Pt is an 84 y/o female s/p L4-5 lumbar decompression and discetomy. PMH includes COPD on 3L of O2, HTN, a fib, and s/p pacemaker.     PT Comments    Patient's tolerance to treatment today was fair.  Patient was in bed with son present upon PT arrival.  Patient was able to recall spinal precautions.  Patient's oxygen sats were measured at 97% on 3L O2 via nasal cannula prior to initiating bed mobility.  Patient demonstrated good rolling technique with use of bed railing and apparent awareness to back position.  Patient required min A and VC for safety awareness and proper hand and feet placement during transfers.  Patient would continue to benefit from acute care PT in order to address balance, mobility, and activity tolerance deficits prior to recommended SNF destination based on current functional status.   Follow Up Recommendations  SNF;Supervision/Assistance - 24 hour     Equipment Recommendations  None recommended by PT    Recommendations for Other Services       Precautions / Restrictions Precautions Precautions: Fall;Back Required Braces or Orthoses: Spinal Brace Spinal Brace: Lumbar corset Restrictions Weight Bearing Restrictions: No    Mobility  Bed Mobility Overal bed mobility: Needs Assistance Bed Mobility: Rolling;Sidelying to Sit Rolling: Min assist Sidelying to sit: Min assist;+2 for physical assistance       General bed mobility comments: Pt demonstrated good technique with rolling with use of bed railing.  Patient was able to bring B LE to EOB only requiring min A from therapist.  Patient required min A+2 to boost into sitting from sidelying and for management of B LE.  Transfers Overall transfer level: Needs assistance Equipment used: Rolling walker (2 wheeled) Transfers: Sit to/from [employer] Sit to Stand: Min assist Stand pivot transfers: Min assist       General transfer comment: Pt required VC to scoot to EOB and for proper hand and feet placement prior to performing sit to stand demonstrating tendency to pull on RW to power up into standing.  Pt continues to require guiding with standing pivot to bedside commode due to urgency and eagerness to move.  Pt required VC to safely back to bedside commode and reach for armrests.  Patient was able to perform sit to stand from [facility] to perform self pericare requiring min A from therapist for steadying.  Ambulation/Gait Ambulation/Gait assistance: Min assist;+2 physical assistance(3rd person (son) for chair follow) Gait Distance (Feet): 40 Feet Assistive device: Rolling walker (2 wheeled) Gait Pattern/deviations: Step-through pattern;Decreased stride length;Trunk flexed Gait velocity: decreased   General Gait Details: pt required min A for steadying with RW and VC to maintain upright posture.  Spinal brace was donned.  Pt became fatigued during gait trial requiring seated rest in recliner chair with oxygen sats measured at 90-91% on 3L O2 via nasal cannula   Stairs             Wheelchair Mobility    Modified Rankin (Stroke Patients Only)       Balance Overall balance assessment: Needs assistance Sitting-balance support: No upper extremity supported;Feet supported Sitting balance-Leahy Scale: Good     Standing balance support: Bilateral upper extremity supported;During functional activity Standing balance-Leahy Scale: Poor Standing balance comment: Reliant on BUE support  Cognition Arousal/Alertness: Awake/alert Behavior During Therapy: WFL for tasks assessed/performed Overall Cognitive Status: Within Functional Limits for tasks assessed Area of Impairment: Safety/judgement                         Safety/Judgement: Decreased awareness of safety             Exercises      General Comments        Pertinent Vitals/Pain Pain Assessment: No/denies pain    Home Living                      Prior Function            PT Goals (current goals can now be found in the care plan section) Acute Rehab PT Goals Patient Stated Goal: none stated PT Goal Formulation: With patient/family Time For Goal Achievement: 06/11/18 Potential to Achieve Goals: Good Progress towards PT goals: Progressing toward goals    Frequency    Min 5X/week      PT Plan Current plan remains appropriate    Co-evaluation              AM-PAC PT "6 Clicks" Daily Activity  Outcome Measure  Difficulty turning over in bed (including adjusting bedclothes, sheets and blankets)?: Unable Difficulty moving from lying on back to sitting on the side of the bed? : A Little Difficulty sitting down on and standing up from a chair with arms (e.g., wheelchair, bedside commode, etc,.)?: A Little Help needed moving to and from a bed to chair (including a wheelchair)?: A Little Help needed walking in hospital room?: A Little Help needed climbing 3-5 steps with a railing? : A Lot 6 Click Score: 15    End of Session Equipment Utilized During Treatment: Gait belt;Back brace;Oxygen Activity Tolerance: Patient tolerated treatment well;Patient limited by fatigue Patient left: in chair;with call bell/phone within reach;with family/visitor present Nurse Communication: Mobility status PT Visit Diagnosis: Unsteadiness on feet (R26.81);History of falling (Z91.81);Muscle weakness (generalized) (M62.81);Pain     Time: 4098-1191 PT Time Calculation (min) (ACUTE ONLY): 35 min  Charges:  $Gait Training: 8-22 mins $Therapeutic Activity: 8-22 mins                     [address] SPTA   Yael Coppess 05/31/2018, 1:43 PM

## 2018-05-31 NOTE — Progress Notes (Signed)
Stephanie Santiago  MRN: [id number] DOB/Age: February 11, 1932 84 y.o.  Orthopedics Procedure: Procedure(s) (LRB): L4-5 decompression/disectomy (N/A)     Subjective: Patient feels much better today. Plan is to DC to [facility] place  Vital Signs Temp:  [98.1 F (36.7 C)-99.4 F (37.4 C)] 98.7 F (37.1 C) (11/09 0827) Pulse Rate:  [61-72] 72 (11/09 0827) Resp:  [16-19] 16 (11/09 0827) BP: (135-170)/(60-90) 152/71 (11/09 0827) SpO2:  [92 %-100 %] 99 % (11/09 0827)  Lab Results Recent Labs    05/29/18 1721  WBC 11.8*  HGB 8.3*  HCT 28.9*  PLT 245   BMET Recent Labs    05/29/18 1721  NA 138  K 3.6  CL 96*  CO2 33*  GLUCOSE 125*  BUN 18  CREATININE 1.52*  CALCIUM 8.9   No results found for: INR   Exam Right LE still weak on EHL, DF Otherwise moving lower ext well in bed        Plan Plan dc to [facility] once ins authorization complete, hopeful today  Ralene Bathe PA-C  05/31/2018, 8:48 AM Contact # [phone]

## 2018-06-01 NOTE — Plan of Care (Signed)
  Problem: Education: Goal: Knowledge of General Education information will improve Description Including pain rating scale, medication(s)/side effects and non-pharmacologic comfort measures Outcome: Progressing   Problem: Health Behavior/Discharge Planning: Goal: Ability to manage health-related needs will improve Outcome: Progressing   Problem: Clinical Measurements: Goal: Ability to maintain clinical measurements within normal limits will improve Outcome: Progressing Goal: Will remain free from infection Outcome: Progressing Goal: Diagnostic test results will improve Outcome: Progressing Goal: Respiratory complications will improve Outcome: Progressing Goal: Cardiovascular complication will be avoided Outcome: Progressing   Problem: Skin Integrity: Goal: Risk for impaired skin integrity will decrease Outcome: Progressing   Problem: Safety: Goal: Ability to remain free from injury will improve Outcome: Progressing   Problem: Elimination: Goal: Will not experience complications related to bowel motility Outcome: Progressing Goal: Will not experience complications related to urinary retention Outcome: Progressing   Problem: Coping: Goal: Level of anxiety will decrease Outcome: Progressing   Problem: Nutrition: Goal: Adequate nutrition will be maintained Outcome: Progressing

## 2018-06-01 NOTE — Progress Notes (Signed)
Subjective: 4 Days Post-Op Procedure(s) (LRB): L4-5 decompression/disectomy (N/A) Patient reports pain as 0 on 0-10 scale.  Doing very well today. She has a chronic foot drop on the right.  Objective: Vital signs in last 24 hours: Temp:  [98.4 F (36.9 C)-98.6 F (37 C)] 98.4 F (36.9 C) (11/10 0518) Pulse Rate:  [60-67] 60 (11/10 0518) Resp:  [16-24] 16 (11/09 2144) BP: (137-173)/(65-71) 155/65 (11/10 0518) SpO2:  [95 %-99 %] 99 % (11/10 0518)  Intake/Output from previous day: 11/09 0701 - 11/10 0700 In: 720 [P.O.:720] Out: -  Intake/Output this shift: No intake/output data recorded.  Recent Labs    05/29/18 1721  HGB 8.3*   Recent Labs    05/29/18 1721  WBC 11.8*  RBC 3.02*  HCT 28.9*  PLT 245   Recent Labs    05/29/18 1721  NA 138  K 3.6  CL 96*  CO2 33*  BUN 18  CREATININE 1.52*  GLUCOSE 125*  CALCIUM 8.9   No results for input(s): LABPT, INR in the last 72 hours.  Chronic foot drop on the right.  Waiting for SNF  Assessment/Plan: 4 Days Post-Op Procedure(s) (LRB): L4-5 decompression/disectomy (N/A) Discharge to SNF    Ranee Gosselin 06/01/2018, 8:34 AM

## 2018-06-01 NOTE — Plan of Care (Signed)

## 2018-06-01 NOTE — Plan of Care (Signed)

## 2018-06-02 MED ORDER — CLORAZEPATE DIPOTASSIUM 3.75 MG PO TABS
3.7500 mg | ORAL_TABLET | Freq: Every day | ORAL | Status: DC | PRN
  Administered 2018-06-02: 3.75 mg via ORAL
  Filled 2018-06-02: qty 1

## 2018-06-02 NOTE — Progress Notes (Signed)
Physical Therapy Treatment Patient Details Name: Stephanie Santiago MRN: [id number] DOB: 08-28-1931 Today's Date: 06/02/2018    History of Present Illness Pt is an 84 y/o female s/p L4-5 lumbar decompression and discetomy. PMH includes COPD on 3L of O2, HTN, a fib, and s/p pacemaker.     PT Comments    Patient seen for mobility progression. Pt is making gradual progress toward PT goals. Pt will continue to benefit from further skilled PT services both acute and post acute to maximize independence and safety with mobility.    Follow Up Recommendations  SNF;Supervision/Assistance - 24 hour     Equipment Recommendations  None recommended by PT    Recommendations for Other Services       Precautions / Restrictions Precautions Precautions: Fall;Back Precaution Comments: pt able to recall 3/3 precautions Required Braces or Orthoses: Spinal Brace Spinal Brace: Lumbar corset    Mobility  Bed Mobility Overal bed mobility: Needs Assistance Bed Mobility: Rolling;Sidelying to Sit Rolling: Min guard Sidelying to sit: Min assist       General bed mobility comments: cues for sequencing and assist to elevate trunk into sitting  Transfers Overall transfer level: Needs assistance Equipment used: Rolling walker (2 wheeled) Transfers: Sit to/from Stand Sit to Stand: Min guard         General transfer comment: min guard for safety; cues for safe hand placement  Ambulation/Gait Ambulation/Gait assistance: Min assist;+2 safety/equipment Gait Distance (Feet): (50 ft X 2 trials with seated rest break required) Assistive device: Rolling walker (2 wheeled) Gait Pattern/deviations: Step-through pattern;Decreased stride length Gait velocity: decreased   General Gait Details: cues for posture and safe use of AD   Stairs             Wheelchair Mobility    Modified Rankin (Stroke Patients Only)       Balance Overall balance assessment: Needs assistance Sitting-balance  support: No upper extremity supported;Feet supported Sitting balance-Leahy Scale: Good     Standing balance support: Bilateral upper extremity supported;During functional activity Standing balance-Leahy Scale: Poor Standing balance comment: Reliant on BUE support                            Cognition Arousal/Alertness: Awake/alert Behavior During Therapy: WFL for tasks assessed/performed Overall Cognitive Status: Within Functional Limits for tasks assessed                                        Exercises      General Comments        Pertinent Vitals/Pain Pain Assessment: Faces Faces Pain Scale: Hurts a little bit Pain Location: back  Pain Descriptors / Indicators: Operative site guarding Pain Intervention(s): Monitored during session;Repositioned    Home Living                      Prior Function            PT Goals (current goals can now be found in the care plan section) Progress towards PT goals: Progressing toward goals    Frequency    Min 5X/week      PT Plan Current plan remains appropriate    Co-evaluation              AM-PAC PT "6 Clicks" Daily Activity  Outcome Measure  Difficulty turning over in bed (including adjusting bedclothes,  sheets and blankets)?: Unable Difficulty moving from lying on back to sitting on the side of the bed? : Unable Difficulty sitting down on and standing up from a chair with arms (e.g., wheelchair, bedside commode, etc,.)?: Unable Help needed moving to and from a bed to chair (including a wheelchair)?: A Little Help needed walking in hospital room?: A Little Help needed climbing 3-5 steps with a railing? : A Little 6 Click Score: 12    End of Session Equipment Utilized During Treatment: Gait belt;Back brace;Oxygen Activity Tolerance: Patient tolerated treatment well Patient left: in chair;with call bell/phone within reach Nurse Communication: Mobility status PT Visit  Diagnosis: Unsteadiness on feet (R26.81);History of falling (Z91.81);Muscle weakness (generalized) (M62.81);Pain Pain - part of body: (back )     Time: 1610-9604 PT Time Calculation (min) (ACUTE ONLY): 24 min  Charges:  $Gait Training: 8-22 mins $Therapeutic Activity: 8-22 mins                     Erline Levine, PTA Acute Rehabilitation Services Pager: [phone] Office: [phone]     Carolynne Edouard 06/02/2018, 9:07 AM

## 2018-06-02 NOTE — Plan of Care (Signed)

## 2018-06-02 NOTE — Progress Notes (Signed)
    Subjective: Procedure(s) (LRB): L4-5 decompression/disectomy (N/A) 5 Days Post-Op  Patient reports pain as 0 on 0-10 scale.  Reports none leg pain reports minimal incisional back pain   Positive void Positive bowel movement Positive flatus Negative chest pain or shortness of breath  Objective: Vital signs in last 24 hours: Temp:  [97.7 F (36.5 C)-98.8 F (37.1 C)] 98.7 F (37.1 C) (11/11 1326) Pulse Rate:  [58-71] 61 (11/11 1610) BP: (139-162)/(62-75) 152/62 (11/11 1610) SpO2:  [97 %-100 %] 100 % (11/11 1610)  Intake/Output from previous day: 11/10 0701 - 11/11 0700 In: 120 [P.O.:120] Out: -   Labs: No results for input(s): WBC, RBC, HCT, PLT in the last 72 hours. No results for input(s): NA, K, CL, CO2, BUN, CREATININE, GLUCOSE, CALCIUM in the last 72 hours. No results for input(s): LABPT, INR in the last 72 hours.  Physical Exam: Neurologically intact ABD soft Intact pulses distally Incision: dressing C/D/I and no drainage Compartment soft Body mass index is 31.89 kg/m.   Assessment/Plan: Patient stable  xrays n/a Continue mobilization with physical therapy Continue care  Advance diet Up with therapy Discharge to SNF in AM Overall doing well - radicular leg pain resolved   Venita Lick, MD [facility] [phone]

## 2018-06-03 NOTE — Discharge Summary (Signed)
Patient ID: Stephanie Santiago MRN: [id number] DOB/AGE: [birth year] 84 y.o.  Admit date: 05/28/2018 Discharge date: 06/03/2018  Admission Diagnoses:  Active Problems:   Lumbar disc herniation   Discharge Diagnoses:  Active Problems:   Lumbar disc herniation  status post Procedure(s): L4-5 decompression/disectomy  Past Medical History:  Diagnosis Date  . Anemia   . Arthritis    bilateral knees  . Asthma   . Bronchitis   . CKD (chronic kidney disease) stage 3, GFR 30-59 ml/min (HCC)   . Dysrhythmia    A-fib  . Hypertension   . Lumbar herniated disc   . Lumbar stenosis    L4-5  . Neuromuscular disorder (HCC)    spine  . PMR (polymyalgia rheumatica) (HCC)   . Presence of permanent cardiac pacemaker   . Pulmonary hypertension (HCC)   . Sleep apnea     Surgeries: Procedure(s): L4-5 decompression/disectomy on 05/28/2018   Consultants: Hospitalist team  Discharged Condition: Improved  Hospital Course: Stephanie Santiago is an 84 y.o. female who was admitted 05/28/2018 for operative treatment of Lumbar disc herniation with radiculopathy Patient failed conservative treatments (please see the history and physical for the specifics) and had severe unremitting pain that affects sleep, daily activities and work/hobbies. After pre-op clearance, the patient was taken to the operating room on 05/28/2018 and underwent  Procedure(s): L4-5 decompression/disectomy.    Patient was given perioperative antibiotics:  Anti-infectives (From admission, onward)   Start     Dose/Rate Route Frequency Ordered Stop   05/28/18 2000  ceFAZolin (ANCEF) IVPB 1 g/50 mL premix     1 g 100 mL/hr over 30 Minutes Intravenous Every 8 hours 05/28/18 1627     05/28/18 1025  ceFAZolin (ANCEF) IVPB 2g/100 mL premix     2 g 200 mL/hr over 30 Minutes Intravenous 30 min pre-op 05/28/18 1025 05/28/18 1202       Patient was given sequential compression devices and early ambulation to prevent DVT.   Patient  benefited maximally from hospital stay and there were no complications. At the time of discharge, the patient was urinating/moving their bowels without difficulty, tolerating a regular diet, pain is controlled with oral pain medications and they have been cleared by PT/OT.   Recent vital signs:  Patient Vitals for the past 24 hrs:  BP Temp Temp src Pulse Resp SpO2  06/03/18 0759 - - - - - 98 %  06/03/18 0327 (!) 148/65 98.4 F (36.9 C) Oral 66 18 98 %  06/02/18 2040 - - - - - 100 %  06/02/18 2012 (!) 150/65 98.7 F (37.1 C) Oral (!) 57 20 100 %  06/02/18 1610 (!) 152/62 - - 61 - 100 %  06/02/18 1326 139/73 98.7 F (37.1 C) Oral 61 - -     Recent laboratory studies: No results for input(s): WBC, HGB, HCT, PLT, NA, K, CL, CO2, BUN, CREATININE, GLUCOSE, INR, CALCIUM in the last 72 hours.  Invalid input(s): PT, 2   Discharge Medications:   Allergies as of 06/03/2018      Reactions   Escitalopram Nausea Only, Other (See Comments)   Pt stated it made her feel crazy   Amoxicillin-pot Clavulanate Diarrhea      Medication List    TAKE these medications   albuterol 108 (90 Base) MCG/ACT inhaler Commonly known as:  PROVENTIL HFA;VENTOLIN HFA Inhale 2 puffs into the lungs every 4 (four) hours as needed for wheezing or shortness of breath.   alendronate 70 MG tablet Commonly known  as:  FOSAMAX Take 70 mg by mouth every Monday.   atenolol 25 MG tablet Commonly known as:  TENORMIN Take 25 mg by mouth 2 (two) times daily.   clorazepate 3.75 MG tablet Commonly known as:  TRANXENE Take 3.75 mg by mouth daily as needed for anxiety.   digoxin 0.125 MG tablet Commonly known as:  LANOXIN Take 0.125 mg by mouth every other day.   flecainide 50 MG tablet Commonly known as:  TAMBOCOR Take 25 mg by mouth 2 (two) times daily.   FLOVENT HFA 110 MCG/ACT inhaler Generic drug:  fluticasone Inhale 2 puffs into the lungs 2 (two) times daily.   furosemide 20 MG tablet Commonly known as:   LASIX Take 20 mg by mouth daily.   gabapentin 300 MG capsule Commonly known as:  NEURONTIN Take 300 mg by mouth 3 (three) times daily.   pantoprazole 40 MG tablet Commonly known as:  PROTONIX Take 40 mg by mouth daily.   predniSONE 5 MG tablet Commonly known as:  DELTASONE Take 5 mg by mouth 2 (two) times daily.   traMADol 50 MG tablet Commonly known as:  ULTRAM Take 50 mg by mouth 3 (three) times daily.   VISINE 0.05 % ophthalmic solution Generic drug:  tetrahydrozoline Place 2 drops into both eyes 2 (two) times daily as needed (for dry eyes).       Diagnostic Studies: Dg Lumbar Spine 1 View  Result Date: 05/28/2018 CLINICAL DATA:  Lumbar spine surgery. EXAM: LUMBAR SPINE - 1 VIEW COMPARISON:  MRI 03/07/2018. FINDINGS: Lumbar spine numbered as per prior MRI. Metallic marker noted at the L3-L4 and L4-L5 levels. No acute bony abnormality. Aortoiliac atherosclerotic vascular calcification. IMPRESSION: Metallic marker noted at the L3-L4 and L4-L5 levels. Electronically Signed   By: Maisie Fus  Register   On: 05/28/2018 14:10   Dg Spine Portable 1 View  Result Date: 05/28/2018 CLINICAL DATA:  Surgical localization. EXAM: PORTABLE SPINE - 1 VIEW COMPARISON:  Lumbar MRI 03/07/2018 FINDINGS: Lowest disc space L5-S1 as noted on the prior MRI report. Surgical instrument overlying the posterior spinal canal at the level the L5 pedicle. IMPRESSION: Surgical level L4-5. These results were called by telephone at the time of interpretation to the operating room and given to Forest Becker RN Electronically Signed   By: Marlan Palau M.D.   On: 05/28/2018 12:41    Discharge Instructions    Call MD / Call 911   Complete by:  As directed    If you experience chest pain or shortness of breath, CALL 911 and be transported to the hospital emergency room.  If you develope a fever above 101 F, pus (white drainage) or increased drainage or redness at the wound, or calf pain, call your surgeon's office.    Constipation Prevention   Complete by:  As directed    Drink plenty of fluids.  Prune juice may be helpful.  You may use a stool softener, such as Colace (over the counter) 100 mg twice a day.  Use MiraLax (over the counter) for constipation as needed.   Diet - low sodium heart healthy   Complete by:  As directed    Incentive spirometry RT   Complete by:  As directed    Increase activity slowly as tolerated   Complete by:  As directed        Contact information for follow-up providers    Venita Lick, MD. Go in 2 week(s).   Specialty:  Orthopedic Surgery Why:  follow  up appointment already made - call to confirm day and time Contact information: [address] [place] [address] [phone]            Contact information for after-discharge care    Destination    [facility]erred [facility] .   Service:  Skilled Nursing Contact information: [address] [place] [id number] [phone]                  Discharge Plan:  discharge to skilled nursing facility for ongoing rehabilitation.  Discharge instructions have been provided.  Disposition: Patient's hospital course has been essentially benign.  Her severe radicular leg pain resolved rapidly.  From a medical standpoint she is remained stable.  Plan on discharge to skilled nursing facility and follow-up with me in 1 week.    Signed: Venita LickBROOKS,Aditri Louischarles D for Dr. Venita Lickahari Yenifer Saccente [facility] [phone] 06/03/2018, 12:36 PM

## 2018-06-03 NOTE — Progress Notes (Addendum)
Patient will DC to: [facility] Anticipated DC date: 06/03/18 Family notified: Pam daughter Transport by: Sharin Mons  Per MD patient ready for DC to [facility] . RN, patient, patient's family, and facility notified of DC. Discharge Summary sent to facility. RN given number for report [phone]. DC packet on chart. Ambulance transport requested for 3:00 pm for patient.  CSW signing off.  [place], [place] [phone]

## 2018-06-03 NOTE — Progress Notes (Signed)
    Subjective: Procedure(s) (LRB): L4-5 decompression/disectomy (N/A) 6 Days Post-Op  Patient reports pain as 0 on 0-10 scale.  Reports none leg pain reports incisional back pain   Positive void Positive bowel movement Positive flatus Negative chest pain or shortness of breath  Objective: Vital signs in last 24 hours: Temp:  [98.4 F (36.9 C)-98.7 F (37.1 C)] 98.4 F (36.9 C) (11/12 0327) Pulse Rate:  [57-66] 66 (11/12 0327) Resp:  [18-20] 18 (11/12 0327) BP: (139-152)/(62-73) 148/65 (11/12 0327) SpO2:  [98 %-100 %] 98 % (11/12 0759)  Intake/Output from previous day: No intake/output data recorded.  Labs: No results for input(s): WBC, RBC, HCT, PLT in the last 72 hours. No results for input(s): NA, K, CL, CO2, BUN, CREATININE, GLUCOSE, CALCIUM in the last 72 hours. No results for input(s): LABPT, INR in the last 72 hours.  Physical Exam: Neurologically intact Neurovascular intact Intact pulses distally Incision: dressing C/D/I and no drainage Compartment soft Body mass index is 31.89 kg/m.   Assessment/Plan: Patient stable  xrays n/a Continue mobilization with physical therapy Continue care  Discharge to SNF  Venita Lickahari Merrick Maggio, MD [facility] [phone]

## 2018-06-03 NOTE — Care Management Important Message (Signed)
Important Message  Patient Details  Name: Stephanie PhenixColleen Santiago MRN: [id number] Date of Birth: 1932/04/30   Medicare Important Message Given:  Yes    Alrick Cubbage Stefan ChurchBratton 06/03/2018, 9:32 AM

## 2018-06-03 NOTE — Clinical Social Work Placement (Signed)
   CLINICAL SOCIAL WORK PLACEMENT  NOTE  Date:  06/03/2018  Patient Details  Name: Stephanie PhenixColleen Santiago MRN: [id number] Date of Birth: 07/04/1932  Clinical Social Work is seeking post-discharge placement for this patient at the Skilled  Nursing Facility level of care (*CSW will initial, date and re-position this form in  chart as items are completed):  Yes   Patient/family provided with [facility] Department's list of facilities offering this level of care within the geographic area requested by the patient (or if unable, by the patient's family).  Yes   Patient/family informed of their freedom to choose among providers that offer the needed level of care, that participate in Medicare, Medicaid or managed care program needed by the patient, have an available bed and are willing to accept the patient.  Yes   Patient/family informed of [facility] ownership interest in [facility] and [facility], as well as of the fact that they are under no obligation to receive care at these facilities.  PASRR submitted to EDS on       PASRR number received on 05/29/18     Existing PASRR number confirmed on       FL2 transmitted to all facilities in geographic area requested by pt/family on       FL2 transmitted to all facilities within larger geographic area on       Patient informed that his/her managed care company has contracts with or will negotiate with certain facilities, including the following:        Yes   Patient/family informed of bed offers received.  [facility]     Physician recommends and patient chooses bed at      [facility]  Patient to be transferred to facility by [facility]     Patient family notified on 06/03/18 of transfer.  Name of family member notified:  Marlaine Hindam Gaines (daughter)     PHYSICIAN       Additional Comment:     _______________________________________________ Gildardo GriffesAshley M Treavon Castilleja, LCSW 06/03/2018, 11:02 AM

## 2018-06-03 NOTE — Progress Notes (Signed)
Patient d/c to SNF, attempted report was transferred to nurse by Alexa NS had to leave message no return call. Patient transported by [facility] services family aware of d/c plan.   Surabhi Gadea, Kae HellerMiranda Lynn, RN

## 2018-06-03 NOTE — Progress Notes (Signed)
Physical Therapy Treatment Patient Details Name: Stephanie PhenixColleen Santiago MRN: [id number] DOB: 12-01-1931 Today's Date: 06/03/2018    History of Present Illness Pt is an 84 y/o female s/p L4-5 lumbar decompression and discetomy. PMH includes COPD on 3L of O2, HTN, a fib, and s/p pacemaker.     PT Comments    Patient seen for mobility progression. Pt continues to present with decreased activity tolerance and generalized weakness. Pt requires min guard/min A for functional transfers and gait training.  Continue to progress as tolerated with anticipated d/c to SNF for further skilled PT services.    Follow Up Recommendations  SNF;Supervision/Assistance - 24 hour     Equipment Recommendations  None recommended by PT    Recommendations for Other Services       Precautions / Restrictions Precautions Precautions: Fall;Back Precaution Comments: pt able to recall 3/3 precautions Required Braces or Orthoses: Spinal Brace Spinal Brace: Lumbar corset    Mobility  Bed Mobility               General bed mobility comments: pt OOB in chair upon arrival   Transfers Overall transfer level: Needs assistance Equipment used: Rolling walker (2 wheeled) Transfers: Sit to/from Stand Sit to Stand: Min guard         General transfer comment: min guard for safety from recliner and BSC; cues for safe hand placement  Ambulation/Gait Ambulation/Gait assistance: Min assist Gait Distance (Feet): 30 Feet Assistive device: Rolling walker (2 wheeled) Gait Pattern/deviations: Step-through pattern;Decreased stride length Gait velocity: decreased   General Gait Details: cues for posture, breathing technique, and safe use of AD   Stairs             Wheelchair Mobility    Modified Rankin (Stroke Patients Only)       Balance Overall balance assessment: Needs assistance Sitting-balance support: No upper extremity supported;Feet supported Sitting balance-Leahy Scale: Good     Standing  balance support: Bilateral upper extremity supported;During functional activity Standing balance-Leahy Scale: Poor Standing balance comment: Reliant on BUE support                            Cognition Arousal/Alertness: Awake/alert Behavior During Therapy: WFL for tasks assessed/performed Overall Cognitive Status: Within Functional Limits for tasks assessed                                        Exercises      General Comments        Pertinent Vitals/Pain Pain Assessment: Faces Faces Pain Scale: Hurts a little bit Pain Location: back  Pain Descriptors / Indicators: Operative site guarding Pain Intervention(s): Monitored during session;Repositioned    Home Living                      Prior Function            PT Goals (current goals can now be found in the care plan section) Progress towards PT goals: Progressing toward goals    Frequency    Min 5X/week      PT Plan Current plan remains appropriate    Co-evaluation              AM-PAC PT "6 Clicks" Daily Activity  Outcome Measure  Difficulty turning over in bed (including adjusting bedclothes, sheets and blankets)?: Unable Difficulty moving from lying  on back to sitting on the side of the bed? : Unable Difficulty sitting down on and standing up from a chair with arms (e.g., wheelchair, bedside commode, etc,.)?: Unable Help needed moving to and from a bed to chair (including a wheelchair)?: A Little Help needed walking in hospital room?: A Little Help needed climbing 3-5 steps with a railing? : A Little 6 Click Score: 12    End of Session Equipment Utilized During Treatment: Gait belt;Back brace;Oxygen Activity Tolerance: Patient tolerated treatment well Patient left: in chair;with call bell/phone within reach Nurse Communication: Mobility status PT Visit Diagnosis: Unsteadiness on feet (R26.81);History of falling (Z91.81);Muscle weakness (generalized)  (M62.81);Pain Pain - part of body: (back )     Time: 1610-9604 PT Time Calculation (min) (ACUTE ONLY): 30 min  Charges:  $Gait Training: 8-22 mins $Therapeutic Activity: 8-22 mins                     Erline Levine, PTA Acute Rehabilitation Services Pager: [phone] Office: [phone]     Carolynne Edouard 06/03/2018, 2:03 PM

## 2018-06-03 NOTE — Discharge Instructions (Signed)
1.  Tylenol as directed for pain control.  If pain is more severe please contact Dr. Shon BatonBrooks for additional prescription.  2.  Okay to shower.  Do not submerge the wound.  3.  Ambulate as much as possible.  Also, remember to regularly do the lower extremity stretching exercises.  4.  Lumbar brace is to be worn whenever patient is ambulating.  Okay to remove brace when patient is in bed or in a chair.

## 2018-06-03 NOTE — Plan of Care (Signed)
  Problem: Education: Goal: Knowledge of General Education information will improve Description: Including pain rating scale, medication(s)/side effects and non-pharmacologic comfort measures Outcome: Completed/Met   Problem: Health Behavior/Discharge Planning: Goal: Ability to manage health-related needs will improve Outcome: Completed/Met   Problem: Clinical Measurements: Goal: Ability to maintain clinical measurements within normal limits will improve Outcome: Completed/Met Goal: Will remain free from infection Outcome: Completed/Met Goal: Diagnostic test results will improve Outcome: Completed/Met Goal: Respiratory complications will improve Outcome: Completed/Met Goal: Cardiovascular complication will be avoided Outcome: Completed/Met   Problem: Activity: Goal: Risk for activity intolerance will decrease Outcome: Completed/Met   Problem: Nutrition: Goal: Adequate nutrition will be maintained Outcome: Completed/Met   Problem: Coping: Goal: Level of anxiety will decrease Outcome: Completed/Met   Problem: Elimination: Goal: Will not experience complications related to bowel motility Outcome: Completed/Met Goal: Will not experience complications related to urinary retention Outcome: Completed/Met   Problem: Safety: Goal: Ability to remain free from injury will improve Outcome: Completed/Met   Problem: Skin Integrity: Goal: Risk for impaired skin integrity will decrease Outcome: Completed/Met   

## 2019-05-24 DEATH — deceased

## 2020-04-24 IMAGING — CR DG SPINE 1V PORT
1 series · 1 of 1 positions shown · non-contrast
Comparison: Lumbar MRI 03/07/2018

CLINICAL DATA: Surgical localization.

EXAM:
PORTABLE SPINE - 1 VIEW

[lateral]
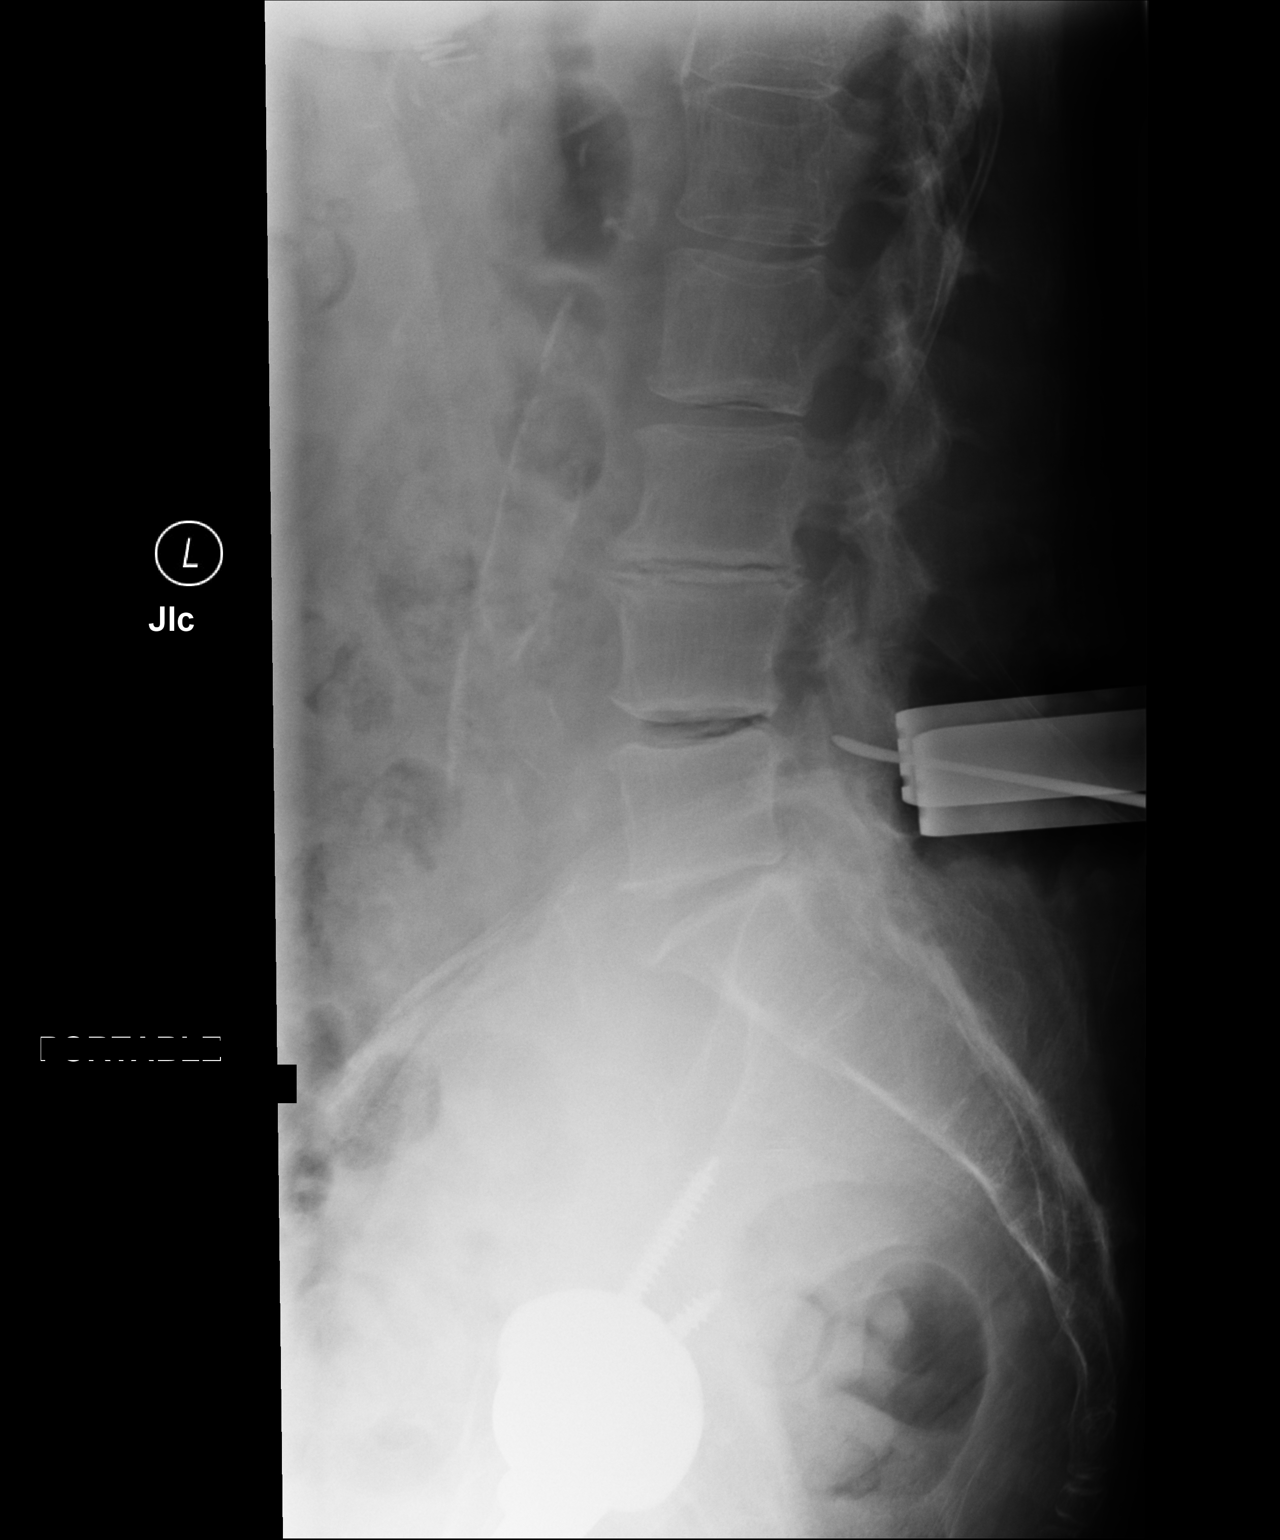

[1 of 1 positions shown; findings below may reference images not displayed]

FINDINGS: Lowest disc space L5-S1 as noted on the prior MRI report.

Surgical instrument overlying the posterior spinal canal at the
level the L5 pedicle.
IMPRESSION: Surgical level L4-5.

These results were called by telephone at the time of interpretation
to the operating room and given to Negica Nero RN
